# Patient Record
Sex: Female | Born: 1995 | Race: White | Hispanic: No | Marital: Single | Smoking: Never smoker
Health system: Southern US, Community
[De-identification: ages and names within clinical notes are randomized; demographics above are authoritative.]

## PROBLEM LIST (undated history)

## (undated) DIAGNOSIS — E663 Overweight: Secondary | ICD-10-CM

## (undated) HISTORY — PX: TONSILLECTOMY AND ADENOIDECTOMY: SUR1326

## (undated) HISTORY — PX: APPENDECTOMY: SHX54

## (undated) HISTORY — PX: TONSILLECTOMY: SUR1361

## (undated) HISTORY — DX: Overweight: E66.3

---
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
## 2016-08-15 ENCOUNTER — Emergency Department (HOSPITAL_COMMUNITY): Payer: 59

## 2016-08-15 ENCOUNTER — Encounter (HOSPITAL_COMMUNITY): Payer: Self-pay

## 2016-08-15 ENCOUNTER — Emergency Department (HOSPITAL_COMMUNITY)
Admission: EM | Admit: 2016-08-15 | Discharge: 2016-08-15 | Disposition: A | Discharge status: Home / Self Care | Payer: 59 | Attending: Emergency Medicine | Admitting: Emergency Medicine

## 2016-08-15 DIAGNOSIS — R0789 Other chest pain: Secondary | ICD-10-CM | POA: Diagnosis not present

## 2016-08-15 DIAGNOSIS — R079 Chest pain, unspecified: Secondary | ICD-10-CM | POA: Diagnosis present

## 2016-08-15 LAB — I-STAT CHEM 8, ED
BUN: 14 mg/dL (ref 6–20)
CALCIUM ION: 1.21 mmol/L (ref 1.15–1.40)
Chloride: 103 mmol/L (ref 101–111)
Creatinine, Ser: 0.6 mg/dL (ref 0.44–1.00)
GLUCOSE: 88 mg/dL (ref 65–99)
HCT: 40 % (ref 36.0–46.0)
HEMOGLOBIN: 13.6 g/dL (ref 12.0–15.0)
Potassium: 3.4 mmol/L — ABNORMAL LOW (ref 3.5–5.1)
Sodium: 139 mmol/L (ref 135–145)
TCO2: 24 mmol/L (ref 0–100)

## 2016-08-15 LAB — I-STAT BETA HCG BLOOD, ED (MC, WL, AP ONLY): I-stat hCG, quantitative: <5 m[IU]/mL (ref <5)

## 2016-08-15 LAB — D-DIMER, QUANTITATIVE (NOT AT ARMC): D DIMER QUANT: 0.39 ug{FEU}/mL (ref 0.00–0.50)

## 2016-08-15 MED ORDER — ALPRAZOLAM 0.25 MG PO TABS
0.2500 mg | ORAL_TABLET | Freq: Once | ORAL | Status: AC
  Administered 2016-08-15: 0.25 mg via ORAL
  Filled 2016-08-15: qty 1

## 2016-08-15 NOTE — ED Triage Notes (Signed)
PT C/O MID-STERNAL CHEST PAIN RADIATING BETWEEN THE SHOULDER BLADES SINCE LAST NIGHT, IN WHICH TODAY SHE GOT SOB DUE TO THE PAIN . PT STS THE PAIN STARTED IN HER LOWER ABDOMEN YESTERDAY AND WENT UP TO HER CHEST, LEFT SHOULDER, AND UPPER BACK SINCE LAST NIGHT. TH PAIN IS NOT REPRODUCED WITH MOVEMENT. DENIES N/V.

## 2016-08-15 NOTE — ED Provider Notes (Signed)
[facility]-EMERGENCY DEPT Provider Note   CSN: [id number] Arrival date & time: 08/15/16  1801     History   Chief Complaint Chief Complaint  Patient presents with  . Chest Pain    HPI Rebecca HiltsSara R Shepherd is a 20 y.o. female.  20 year old female presents with two-day history of lower abdominal pain which is now radiating to her upper chest and between her shoulder blades goes on her arm. Patient states that the lower abdominal pain is since subsided. Patient did have some associated dyspnea. No syncope or near syncope. Denies any vaginal bleeding or discharge. No urinary symptoms. Pain is not worse with movement. Does admit to increased stress and anxiety recently. Denies any lower leg pain. No recent travel history.      History reviewed. No pertinent past medical history.  Patient Active Problem List   Diagnosis Date Noted  . Acute appendicitis 11/14/2015    Past Surgical History:  Procedure Laterality Date  . LAPAROSCOPIC APPENDECTOMY N/A 11/14/2015   Procedure: APPENDECTOMY LAPAROSCOPIC;  Surgeon: Emelia LoronMatthew Wakefield, MD;  Location: [facility] OR;  Service: General;  Laterality: N/A;  . TONSILLECTOMY      OB History    No data available       Home Medications    Prior to Admission medications   Medication Sig Start Date End Date Taking? Authorizing Provider  albendazole (ALBENZA) 200 MG tablet Take 2 tablets (400 mg total) by mouth once. Then repeat taking 2 tabs by mouth 2 weeks later 11/19/15   Judyann Munsonynthia Snider, MD  cetirizine (ZYRTEC) 10 MG tablet Take 10 mg by mouth at bedtime.    Historical Provider, MD  fluticasone (FLONASE) 50 MCG/ACT nasal spray Place 1 spray into both nostrils daily as needed for allergies or rhinitis (for congestion).    Historical Provider, MD  HYDROcodone-acetaminophen (NORCO/VICODIN) 5-325 MG tablet Take 1-2 tablets by mouth every 4 (four) hours as needed for moderate pain. 11/15/15   Chevis PrettyPaul Toth III, MD  VELIVET 0.1/0.125/0.15 -0.025 MG tablet Take 1 tablet by  mouth at bedtime. 11/08/15   Historical Provider, MD    Family History History reviewed. No pertinent family history.  Social History Social History  Substance Use Topics  . Smoking status: Never Smoker  . Smokeless tobacco: Never Used  . Alcohol use No     Allergies   Augmentin [amoxicillin-pot clavulanate]   Review of Systems Review of Systems  All other systems reviewed and are negative.    Physical Exam Updated Vital Signs BP 124/79 (BP Location: Right Arm)   Pulse 90   Temp 98.7 F (37.1 C) (Oral)   Resp 20   Ht 5\' 8"  (1.727 m)   Wt 81.6 kg   LMP 07/25/2016   SpO2 100%   BMI 27.37 kg/m   Physical Exam  Constitutional: She is oriented to person, place, and time. She appears well-developed and well-nourished.  Non-toxic appearance. No distress.  HENT:  Head: Normocephalic and atraumatic.  Eyes: Conjunctivae, EOM and lids are normal. Pupils are equal, round, and reactive to light.  Neck: Normal range of motion. Neck supple. No tracheal deviation present. No thyroid mass present.  Cardiovascular: Regular rhythm and normal heart sounds.  Tachycardia present.  Exam reveals no gallop.   No murmur heard. Pulmonary/Chest: Effort normal and breath sounds normal. No stridor. No respiratory distress. She has no decreased breath sounds. She has no wheezes. She has no rhonchi. She has no rales.  Abdominal: Soft. Normal appearance and bowel sounds are normal. She  exhibits no distension. There is no tenderness. There is no rebound and no CVA tenderness.  Musculoskeletal: Normal range of motion. She exhibits no edema or tenderness.  Neurological: She is alert and oriented to person, place, and time. She has normal strength. No cranial nerve deficit or sensory deficit. GCS eye subscore is 4. GCS verbal subscore is 5. GCS motor subscore is 6.  Skin: Skin is warm and dry. No abrasion and no rash noted.  Psychiatric: Her speech is normal and behavior is normal. Her mood appears  anxious.  Nursing note and vitals reviewed.    ED Treatments / Results  Labs (all labs ordered are listed, but only abnormal results are displayed) Labs Reviewed  D-DIMER, QUANTITATIVE (NOT AT [facility])  I-STAT CHEM 8, ED  I-STAT BETA HCG BLOOD, ED ([facility], [facility], [facility] ONLY)    EKG  EKG Interpretation  Date/Time:  Sunday August 15 2016 18:14:52 EST Ventricular Rate:  104 PR Interval:    QRS Duration: 84 QT Interval:  337 QTC Calculation: 444 R Axis:   75 Text Interpretation:  Sinus tachycardia Probable left atrial enlargement Baseline wander in lead(s) V5 Confirmed by Freida BusmanALLEN  MD, Summar Mcglothlin ([id number]) on 08/15/2016 6:23:03 PM       Radiology Dg Chest 2 View  Result Date: 08/15/2016 CLINICAL DATA:  Chest pain, short of breath EXAM: CHEST  2 VIEW COMPARISON:  None. FINDINGS: Normal mediastinum and cardiac silhouette. Normal pulmonary vasculature. No evidence of effusion, infiltrate, or pneumothorax. No acute bony abnormality. IMPRESSION: No acute cardiopulmonary process. Electronically Signed   By: Genevive BiStewart  Edmunds M.D.   On: 08/15/2016 18:28    Procedures Procedures (including critical care time)  Medications Ordered in ED Medications  ALPRAZolam (XANAX) tablet 0.25 mg (not administered)     Initial Impression / Assessment and Plan / ED Course  I have reviewed the triage vital signs and the nursing notes.  Pertinent labs & imaging results that were available during my care of the patient were reviewed by me and considered in my medical decision making (see chart for details).  Clinical Course     Patient given Xanax and feels better. D-dimer negative. Patient has been very anxious and suspect this is was causing her tachycardia. When I speak to the patient and she comes down her heart rate goes down to the high 90s. When she becomes ages and does go up. Chest x-ray reassuring. We discharged home with primary care referral given  Final Clinical Impressions(s) / ED Diagnoses    Final diagnoses:  None    New Prescriptions New Prescriptions   No medications on file     Lorre NickAnthony Declan Adamson, MD 08/15/16 2026

## 2016-08-15 NOTE — ED Notes (Signed)
Signature pad not working. Secretary made aware 

## 2016-08-17 ENCOUNTER — Institutional Professional Consult (permissible substitution): Payer: Self-pay | Admitting: Family Medicine

## 2016-08-18 ENCOUNTER — Other Ambulatory Visit: Payer: Self-pay | Admitting: Family Medicine

## 2016-08-18 ENCOUNTER — Ambulatory Visit (INDEPENDENT_AMBULATORY_CARE_PROVIDER_SITE_OTHER): Payer: 59 | Admitting: Family Medicine

## 2016-08-18 ENCOUNTER — Encounter: Payer: Self-pay | Admitting: Family Medicine

## 2016-08-18 VITALS — BP 120/80 | HR 99 | Wt 201.0 lb

## 2016-08-18 DIAGNOSIS — F439 Reaction to severe stress, unspecified: Secondary | ICD-10-CM

## 2016-08-18 DIAGNOSIS — F419 Anxiety disorder, unspecified: Secondary | ICD-10-CM | POA: Diagnosis not present

## 2016-08-18 LAB — CBC WITH DIFFERENTIAL/PLATELET
BASOS ABS: 60 {cells}/uL (ref 0–200)
Basophils Relative: 1 %
EOS ABS: 180 {cells}/uL (ref 15–500)
EOS PCT: 3 %
HCT: 38.4 % (ref 35.0–45.0)
HEMOGLOBIN: 12.8 g/dL (ref 11.7–15.5)
LYMPHS ABS: 2280 {cells}/uL (ref 850–3900)
Lymphocytes Relative: 38 %
MCH: 28.6 pg (ref 27.0–33.0)
MCHC: 33.3 g/dL (ref 32.0–36.0)
MCV: 85.7 fL (ref 80.0–100.0)
MPV: 9.4 fL (ref 7.5–12.5)
Monocytes Absolute: 480 cells/uL (ref 200–950)
Monocytes Relative: 8 %
NEUTROS ABS: 3000 {cells}/uL (ref 1500–7800)
Neutrophils Relative %: 50 %
Platelets: 215 10*3/uL (ref 140–400)
RBC: 4.48 MIL/uL (ref 3.80–5.10)
RDW: 13 % (ref 11.0–15.0)
WBC: 6 10*3/uL (ref 4.0–10.5)

## 2016-08-18 LAB — COMPREHENSIVE METABOLIC PANEL
ALBUMIN: 4.6 g/dL (ref 3.6–5.1)
ALK PHOS: 65 U/L (ref 47–176)
ALT: 46 U/L — ABNORMAL HIGH (ref 5–32)
AST: 28 U/L (ref 12–32)
BILIRUBIN TOTAL: 0.4 mg/dL (ref 0.2–1.1)
BUN: 13 mg/dL (ref 7–20)
CHLORIDE: 105 mmol/L (ref 98–110)
CO2: 23 mmol/L (ref 20–31)
CREATININE: 0.67 mg/dL (ref 0.50–1.00)
Calcium: 9.7 mg/dL (ref 8.9–10.4)
Glucose, Bld: 90 mg/dL (ref 65–99)
Potassium: 4 mmol/L (ref 3.8–5.1)
SODIUM: 137 mmol/L (ref 135–146)
TOTAL PROTEIN: 7.3 g/dL (ref 6.3–8.2)

## 2016-08-18 LAB — TSH: TSH: 1.2 m[IU]/L (ref 0.50–4.30)

## 2016-08-18 NOTE — Progress Notes (Signed)
Subjective:    Patient ID: Rebecca Shepherd, female    DOB: 07-02-1996, 20 y.o.   MRN: [id number]  HPI Chief Complaint  Patient presents with  . new pt    new pt established. panic attacks- last year and half. declines flu shot   She is new to the practice and here to establish care and for an acute complaint.  States she has been having anxiety and thinks she is having panic attacks. States 3 days ago she was having chest pain and went to ED. Ruled out cardiac etiology.  States she is having 2 panic attacks per week.   States the intensity and duration of the attacks are worse over the past several weeks. Reports panic attacks used to last 5 minutes and now states they are lasting 25-30 minutes. Mainly happening in the evening time. She has never taken medication for these.  Parents are separated, this has been recently. 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 States she feels an increase in stress.  She has been on birth control pills and stopped taking them 2 months ago. Had one cycle. States she was taking hormones to regulate her menstrual cycles and not for contraception. Denies being sexually active. States she has never had sex.   Previous medical care: pediatrician until age 20 Dr. Hart RochesterLawson.   Other providers: OB/GYN Dr. Jarold SongMeissinger is OB/GYN   Past medical history: year round allergies- takes Zyrtec.  Has taken allergy injections in past but stopped. Had an allergist but stopped going.   Surgeries: appendectomy last year. Tonsils and adenoids.   Family history: father with depression. Sister with IBS. Mother diabetes, hypertension, hyperlipidemia.  Mental Health History: anxiety as childhood.   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 [employer] blue  Denies smoking, drinking alcohol, drug use  Diet: nothing particular.  Excerise: twice weekly   Immunizations: refuses flu shot.   Wears seatbelt always, uses  sunscreen, smoke detectors in home and functioning, does not text while driving and feels safe in home environment.   Reviewed allergies, medications, past medical, surgical, family, and social history.   GAD 7 : Generalized Anxiety Score 08/18/2016  Nervous, Anxious, on Edge 1  Control/stop worrying 1  Worry too much - different things 1  Trouble relaxing 1  Restless 1  Easily annoyed or irritable 2  Afraid - awful might happen 0  Total GAD 7 Score 7  Anxiety Difficulty Somewhat difficult    Depression screen PHQ 2/9 08/18/2016  Decreased Interest 0  Down, Depressed, Hopeless 1  PHQ - 2 Score 1  Altered sleeping 1  Tired, decreased energy 1  Change in appetite 0  Feeling bad or failure about yourself  0  Trouble concentrating 1  Moving slowly or fidgety/restless 1  Suicidal thoughts 0  PHQ-9 Score 5       Review of Systems Pertinent positives and negatives in the history of present illness.     Objective:   Physical Exam  Constitutional: She is oriented to person, place, and time. She appears well-developed and well-nourished. No distress.  HENT:  Mouth/Throat: Oropharynx is clear and moist.  Neck: Normal range of motion. Neck supple. No thyromegaly present.  Cardiovascular: Normal rate, regular rhythm, normal heart sounds and intact distal pulses.  Exam reveals no gallop and no friction rub.   No murmur heard. Pulmonary/Chest: Effort normal and breath sounds normal.  Lymphadenopathy:    She  has no cervical adenopathy.  Neurological: She is alert and oriented to person, place, and time. Coordination normal.  Skin: Skin is warm and dry. No rash noted. No erythema. No pallor.  Psychiatric: Her speech is normal and behavior is normal. Judgment and thought content normal. Her mood appears anxious. Cognition and memory are normal. She expresses no homicidal ideation. She expresses no homicidal plans.   BP 120/80   Pulse 99   Wt 201 lb (91.2 kg)   LMP 07/25/2016    BMI 30.56 kg/m      Assessment & Plan:  Anxiety - Plan: CBC with Differential/Platelet, Comprehensive metabolic panel, TSH, Ambulatory referral to Psychology  Stress - Plan: Ambulatory referral to Psychology  Reviewed ED visit in chart and they ruled out a cardiac etiology. Negative D-dimer. No chest pain in past 36 hours.  She does not want to start on medication for anxiety. She is in agreement to call and schedule a counseling appointment. I do not feel that she is a danger to herself or others.  Plan to have her start counseling and follow up in 1 month.  Follow up pending labs or sooner if needed.

## 2016-08-18 NOTE — Patient Instructions (Signed)
Call and schedule the counseling appointment.  Follow up in 1 month or sooner if needed.  We will call you with lab results.   Generalized Anxiety Disorder Generalized anxiety disorder (GAD) is a mental disorder. It interferes with life functions, including relationships, work, and school. GAD is different from normal anxiety, which everyone experiences at some point in their lives in response to specific life events and activities. Normal anxiety actually helps us prepare for and get through these life events and activities. Normal anxiety goes away after the event or activity is over.  GAD causes anxiety that is not necessarily related to specific events or activities. It also causes excess anxiety in proportion to specific events or activities. The anxiety associated with GAD is also difficult to control. GAD can vary from mild to severe. People with severe GAD can have intense waves of anxiety with physical symptoms (panic attacks).  SYMPTOMS The anxiety and worry associated with GAD are difficult to control. This anxiety and worry are related to many life events and activities and also occur more days than not for 6 months or longer. People with GAD also have three or more of the following symptoms (one or more in children):  Restlessness.   Fatigue.  Difficulty concentrating.   Irritability.  Muscle tension.  Difficulty sleeping or unsatisfying sleep. DIAGNOSIS GAD is diagnosed through an assessment by your health care provider. Your health care provider will ask you questions aboutyour mood,physical symptoms, and events in your life. Your health care provider may ask you about your medical history and use of alcohol or drugs, including prescription medicines. Your health care provider may also do a physical exam and blood tests. Certain medical conditions and the use of certain substances can cause symptoms similar to those associated with GAD. Your health care provider may refer  you to a mental health specialist for further evaluation. TREATMENT The following therapies are usually used to treat GAD:   Medication. Antidepressant medication usually is prescribed for long-term daily control. Antianxiety medicines may be added in severe cases, especially when panic attacks occur.   Talk therapy (psychotherapy). Certain types of talk therapy can be helpful in treating GAD by providing support, education, and guidance. A form of talk therapy called cognitive behavioral therapy can teach you healthy ways to think about and react to daily life events and activities.  Stress managementtechniques. These include yoga, meditation, and exercise and can be very helpful when they are practiced regularly. A mental health specialist can help determine which treatment is best for you. Some people see improvement with one therapy. However, other people require a combination of therapies. This information is not intended to replace advice given to you by your health care provider. Make sure you discuss any questions you have with your health care provider. Document Released: 01/15/2013 Document Revised: 10/11/2014 Document Reviewed: 01/15/2013 Elsevier Interactive Patient Education  2017 Elsevier Inc.     Panic Attacks Panic attacks are sudden, short feelings of great fear or discomfort. You may have them for no reason when you are relaxed, when you are uneasy (anxious), or when you are sleeping. Follow these instructions at home:  Take all your medicines as told.  Check with your doctor before starting new medicines.  Keep all doctor visits. Contact a doctor if:  You are not able to take your medicines as told.  Your symptoms do not get better.  Your symptoms get worse. Get help right away if:  Your attacks seem different than  your normal attacks.  You have thoughts about hurting yourself or others.  You take panic attack medicine and you have a side effect. This  information is not intended to replace advice given to you by your health care provider. Make sure you discuss any questions you have with your health care provider. Document Released: 10/23/2010 Document Revised: 02/26/2016 Document Reviewed: 05/04/2013 Elsevier Interactive Patient Education  2017 [employer]

## 2016-08-19 LAB — HEPATITIS PANEL, ACUTE
HCV AB: NEGATIVE
HEP B C IGM: NONREACTIVE
HEP B S AG: NEGATIVE
Hep A IgM: NONREACTIVE

## 2016-08-24 ENCOUNTER — Telehealth: Payer: Self-pay | Admitting: Family Medicine

## 2016-08-24 NOTE — Telephone Encounter (Signed)
Left detailed message that sometimes they take longer if busy and with holidays coming up, they might take a while, gave phone number for [facility] if she wanted to call there

## 2016-08-24 NOTE — Telephone Encounter (Signed)
Pt has called [facility] 3 times and left a message and no one has called her back. Pt wants to know if this is normal or if Vickie thinks that she should call another office.

## 2016-08-24 NOTE — Telephone Encounter (Signed)
Please call and find out which number she has been trying to call to schedule her appointment. Please give her other phone numbers.

## 2016-09-15 ENCOUNTER — Ambulatory Visit (INDEPENDENT_AMBULATORY_CARE_PROVIDER_SITE_OTHER): Payer: 59 | Admitting: Family Medicine

## 2016-09-15 ENCOUNTER — Encounter: Payer: Self-pay | Admitting: Family Medicine

## 2016-09-15 VITALS — BP 110/70 | HR 81 | Wt 203.0 lb

## 2016-09-15 DIAGNOSIS — R7989 Other specified abnormal findings of blood chemistry: Secondary | ICD-10-CM

## 2016-09-15 DIAGNOSIS — G44209 Tension-type headache, unspecified, not intractable: Secondary | ICD-10-CM | POA: Diagnosis not present

## 2016-09-15 DIAGNOSIS — R945 Abnormal results of liver function studies: Secondary | ICD-10-CM

## 2016-09-15 DIAGNOSIS — F419 Anxiety disorder, unspecified: Secondary | ICD-10-CM

## 2016-09-15 LAB — COMPREHENSIVE METABOLIC PANEL
ALT: 33 U/L — ABNORMAL HIGH (ref 5–32)
AST: 27 U/L (ref 12–32)
Albumin: 4.2 g/dL (ref 3.6–5.1)
Alkaline Phosphatase: 63 U/L (ref 47–176)
BILIRUBIN TOTAL: 0.6 mg/dL (ref 0.2–1.1)
BUN: 10 mg/dL (ref 7–20)
CHLORIDE: 104 mmol/L (ref 98–110)
CO2: 26 mmol/L (ref 20–31)
CREATININE: 0.68 mg/dL (ref 0.50–1.00)
Calcium: 9.4 mg/dL (ref 8.9–10.4)
GLUCOSE: 80 mg/dL (ref 65–99)
Potassium: 3.9 mmol/L (ref 3.8–5.1)
SODIUM: 138 mmol/L (ref 135–146)
Total Protein: 7.1 g/dL (ref 6.3–8.2)

## 2016-09-15 NOTE — Patient Instructions (Signed)

## 2016-09-15 NOTE — Progress Notes (Signed)
   Subjective:    Patient ID: Rebecca HiltsSara R Rayle, female    DOB: Feb 22, 1996, 20 y.o.   MRN: [id number]  HPI Chief Complaint  Patient presents with  . 4 week follow-up    4 week follow-up. frequent headaches   She is here for follow up on anxiety. States she is doing better. Panic attacks are not as frequent. She is on break from school now until January.   She has not been to counseling yet but has an appointment on January 13 with Berniece AndreasJulie Whitt. She states she is ok waiting until then.   Complains of getting headaches that come on typically later in the day. Reports having 3 in the past 10 days. Does not have a headache today.  Reports having a dull ache behind her eyes and occasionally at the base of her neck. Pain is constant and non pulsating.  States she feels like they her head is being squeezed. Has been studying for exams lately and has reduced her caffeine intake pretty drastically.  Denies photophobia, phonophobia, blurred or double vision, dizziness,  N/V/D. No fever, chills, chest pain, shortness of breath, abdominal or back pain.  Has been getting more sleep since finishing exams and thinks this might help.  Has seen a chiropractor in the past for headaches. Plans to schedule appointment for this.  Does not smoke or drink alcohol.   Her LFTs were elevated previously and negative hepatitis panel. Plan to recheck labs today.   Reviewed allergies, medications, past medical, and social history.    Review of Systems Pertinent positives and negatives in the history of present illness.     Objective:   Physical Exam BP 110/70   Pulse 81   Wt 203 lb (92.1 kg)   BMI 30.87 kg/m   Alert and oriented and in no acute distress. Not otherwise examined.       Assessment & Plan:  Anxiety  Tension-type headache, not intractable, unspecified chronicity pattern  Elevated LFTs - Plan: Comprehensive metabolic panel  Discussed that her anxiety appears to be improving. She has decreased  stress currently being out of school break. She will see her counselor as schedules in January and see me sooner if needed.  Recommend for her headaches that sound like tension headaches, make sure she is well hydrated, getting regular sleep and to use good body mechanics. She may get a massage or see the chiropractor. Discussed using heat to her neck and doing stretches. She may take ibuprofen as needed. She will call or return if her headaches increase in frequency or become more bothersome. Will repeat LFTs today. Follow-up pending labs or as needed for headaches and anxiety.

## 2016-10-19 ENCOUNTER — Ambulatory Visit (INDEPENDENT_AMBULATORY_CARE_PROVIDER_SITE_OTHER): Payer: 59 | Admitting: Licensed Clinical Social Worker

## 2016-10-19 DIAGNOSIS — F410 Panic disorder [episodic paroxysmal anxiety] without agoraphobia: Secondary | ICD-10-CM

## 2016-11-10 ENCOUNTER — Ambulatory Visit: Payer: 59 | Admitting: Licensed Clinical Social Worker

## 2016-11-25 ENCOUNTER — Ambulatory Visit (INDEPENDENT_AMBULATORY_CARE_PROVIDER_SITE_OTHER): Payer: 59 | Admitting: Licensed Clinical Social Worker

## 2016-11-25 DIAGNOSIS — F410 Panic disorder [episodic paroxysmal anxiety] without agoraphobia: Secondary | ICD-10-CM | POA: Diagnosis not present

## 2016-12-16 ENCOUNTER — Ambulatory Visit (INDEPENDENT_AMBULATORY_CARE_PROVIDER_SITE_OTHER): Payer: 59 | Admitting: Licensed Clinical Social Worker

## 2016-12-16 DIAGNOSIS — F410 Panic disorder [episodic paroxysmal anxiety] without agoraphobia: Secondary | ICD-10-CM

## 2017-01-13 ENCOUNTER — Ambulatory Visit: Payer: 59 | Admitting: Licensed Clinical Social Worker

## 2017-01-27 ENCOUNTER — Ambulatory Visit (INDEPENDENT_AMBULATORY_CARE_PROVIDER_SITE_OTHER): Payer: 59 | Admitting: Licensed Clinical Social Worker

## 2017-01-27 DIAGNOSIS — F410 Panic disorder [episodic paroxysmal anxiety] without agoraphobia: Secondary | ICD-10-CM

## 2017-03-04 ENCOUNTER — Ambulatory Visit: Payer: 59 | Admitting: Licensed Clinical Social Worker

## 2017-03-24 ENCOUNTER — Ambulatory Visit: Payer: 59 | Admitting: Licensed Clinical Social Worker

## 2017-04-14 ENCOUNTER — Ambulatory Visit (INDEPENDENT_AMBULATORY_CARE_PROVIDER_SITE_OTHER): Payer: 59 | Admitting: Licensed Clinical Social Worker

## 2017-04-14 DIAGNOSIS — F410 Panic disorder [episodic paroxysmal anxiety] without agoraphobia: Secondary | ICD-10-CM

## 2017-04-19 ENCOUNTER — Encounter: Payer: Self-pay | Admitting: Family Medicine

## 2017-04-19 ENCOUNTER — Ambulatory Visit (INDEPENDENT_AMBULATORY_CARE_PROVIDER_SITE_OTHER): Payer: 59 | Admitting: Family Medicine

## 2017-04-19 VITALS — BP 120/80 | HR 87 | Temp 98.7°F | Wt 191.1 lb

## 2017-04-19 DIAGNOSIS — R350 Frequency of micturition: Secondary | ICD-10-CM | POA: Diagnosis not present

## 2017-04-19 DIAGNOSIS — M545 Low back pain, unspecified: Secondary | ICD-10-CM

## 2017-04-19 DIAGNOSIS — R1031 Right lower quadrant pain: Secondary | ICD-10-CM | POA: Diagnosis not present

## 2017-04-19 DIAGNOSIS — N3001 Acute cystitis with hematuria: Secondary | ICD-10-CM | POA: Diagnosis not present

## 2017-04-19 DIAGNOSIS — R748 Abnormal levels of other serum enzymes: Secondary | ICD-10-CM

## 2017-04-19 LAB — COMPREHENSIVE METABOLIC PANEL WITH GFR
ALT: 19 U/L (ref 5–32)
AST: 19 U/L (ref 12–32)
Albumin: 4.3 g/dL (ref 3.6–5.1)
Alkaline Phosphatase: 62 U/L (ref 47–176)
BUN: 13 mg/dL (ref 7–20)
CO2: 22 mmol/L (ref 20–31)
Calcium: 9.4 mg/dL (ref 8.9–10.4)
Chloride: 103 mmol/L (ref 98–110)
Creat: 0.72 mg/dL (ref 0.50–1.00)
Glucose, Bld: 76 mg/dL (ref 65–99)
Potassium: 4.2 mmol/L (ref 3.8–5.1)
Sodium: 137 mmol/L (ref 135–146)
Total Bilirubin: 0.5 mg/dL (ref 0.2–1.1)
Total Protein: 7.1 g/dL (ref 6.3–8.2)

## 2017-04-19 LAB — CBC WITH DIFFERENTIAL/PLATELET
Basophils Absolute: 51 {cells}/uL (ref 0–200)
Basophils Relative: 1 %
Eosinophils Absolute: 204 {cells}/uL (ref 15–500)
Eosinophils Relative: 4 %
HCT: 39.2 % (ref 35.0–45.0)
Hemoglobin: 13.2 g/dL (ref 11.7–15.5)
Lymphocytes Relative: 39 %
Lymphs Abs: 1989 {cells}/uL (ref 850–3900)
MCH: 29.7 pg (ref 27.0–33.0)
MCHC: 33.7 g/dL (ref 32.0–36.0)
MCV: 88.1 fL (ref 80.0–100.0)
MPV: 9.7 fL (ref 7.5–12.5)
Monocytes Absolute: 357 {cells}/uL (ref 200–950)
Monocytes Relative: 7 %
Neutro Abs: 2499 {cells}/uL (ref 1500–7800)
Neutrophils Relative %: 49 %
Platelets: 206 K/uL (ref 140–400)
RBC: 4.45 MIL/uL (ref 3.80–5.10)
RDW: 13.1 % (ref 11.0–15.0)
WBC: 5.1 K/uL (ref 4.0–10.5)

## 2017-04-19 LAB — POCT URINALYSIS DIP (PROADVANTAGE DEVICE)
BILIRUBIN UA: NEGATIVE mg/dL
Bilirubin, UA: NEGATIVE
GLUCOSE UA: NEGATIVE mg/dL
Leukocytes, UA: NEGATIVE
Nitrite, UA: NEGATIVE
Protein Ur, POC: NEGATIVE mg/dL
SPECIFIC GRAVITY, URINE: 1.03
Urobilinogen, Ur: NEGATIVE
pH, UA: 6 (ref 5.0–8.0)

## 2017-04-19 MED ORDER — NITROFURANTOIN MONOHYD MACRO 100 MG PO CAPS: 100.0000 mg | ORAL_CAPSULE | Freq: Two times a day (BID) | ORAL | 0 refills | Status: DC

## 2017-04-19 NOTE — Patient Instructions (Signed)
Take the antibiotic with food as prescribed.  Increase water intake.  You may take over the counter AZO for today and tomorrow if needed and then stop.  If your symptoms worsen or are not resolved after completing the antibiotic then you need to be seen.   Return for a repeat urine test (LAB VISIT ONLY) in 3 weeks. Ideally you would not be menstruating for this test.

## 2017-04-19 NOTE — Progress Notes (Signed)
Subjective:  Rebecca Shepherd is a 21 y.o. female who complains of possible urinary tract infection.  She has had symptoms for 5 day.  Symptoms include urinary frequency, urgency, intermittent RLQ pain, right flank pain that is intermittent. Patient denies fever, chills, vomiting, diarrhea, vaginal discharge.  Last UTI was years ago.   Using nothing for current symptoms.  RLQ pain is relieved with urination.  Normal bowel movement without blood yesterday per patient.   Patient does not have a history of recurrent UTI. Patient does not have a history of pyelonephritis.  No other aggravating or relieving factors.  No other c/o.  History of appendectomy last year. Has had mildly elevated ALT in the past.   History reviewed. No pertinent past medical history.   LMP: 2 weeks ago.  Contraception: daily OCP. Started back on this 3 months ago to regulate her periods.  Sexually activity: is not active. Denies ever having sex.   ROS as in subjective  Reviewed allergies, medications, past medical, surgical, and social history.    Objective: Vitals:   04/19/17 0819  BP: 120/80  Pulse: 87  Temp: 98.7 F (37.1 C)    General appearance: alert, no distress, WD/WN, female Abdomen: +bs, soft, non tender, non distended, no masses, no hepatomegaly, no splenomegaly, no bruits Back: no CVA tenderness GU: declined      Laboratory:  Urine dipstick: 1+ for hemoglobin.       Assessment: Acute cystitis with hematuria - Plan: Comprehensive metabolic panel, CBC with Differential/Platelet  Urinary frequency - Plan: POCT Urinalysis DIP (Proadvantage Device)  RLQ abdominal pain - Plan: Comprehensive metabolic panel, CBC with Differential/Platelet  Acute right-sided low back pain without sciatica  Elevated liver enzymes - Plan: Comprehensive metabolic panel    Plan: Discussed symptoms, diagnosis, possible complications, and usual course of illness.  Begin Macrobid as discussed.   Advised increased  water intake, can use OTC Tylenol for pain.    Advised she may take AZO today and tomorrow.   Urine culture was not sent.  Will recheck LFTs.  Suspect her RLQ and R low back pain are related to UTI. She will let me know if symptoms worsen or any new symptoms present. Advised on when to seek emergency medical treatment.  She denies ever being sexually active so a pelvic exam or UPT not done.   Call or return if worse or not improving.  Follow up repeat UA in 3 weeks to ensure no hematuria.

## 2017-04-20 ENCOUNTER — Telehealth: Payer: Self-pay | Admitting: Internal Medicine

## 2017-04-20 NOTE — Telephone Encounter (Signed)
error 

## 2017-05-04 ENCOUNTER — Encounter (HOSPITAL_COMMUNITY): Payer: Self-pay | Admitting: Emergency Medicine

## 2017-05-04 ENCOUNTER — Emergency Department (HOSPITAL_COMMUNITY)
Admission: EM | Admit: 2017-05-04 | Discharge: 2017-05-04 | Disposition: A | Discharge status: Home / Self Care | Payer: 59 | Attending: Emergency Medicine | Admitting: Emergency Medicine

## 2017-05-04 DIAGNOSIS — R112 Nausea with vomiting, unspecified: Secondary | ICD-10-CM | POA: Diagnosis not present

## 2017-05-04 DIAGNOSIS — R197 Diarrhea, unspecified: Secondary | ICD-10-CM | POA: Diagnosis not present

## 2017-05-04 DIAGNOSIS — Z79899 Other long term (current) drug therapy: Secondary | ICD-10-CM | POA: Diagnosis not present

## 2017-05-04 LAB — URINALYSIS, ROUTINE W REFLEX MICROSCOPIC
Bacteria, UA: NONE SEEN
Glucose, UA: NEGATIVE mg/dL
Ketones, ur: 5 mg/dL — AB
LEUKOCYTES UA: NEGATIVE
NITRITE: NEGATIVE
PH: 5 (ref 5.0–8.0)
Protein, ur: NEGATIVE mg/dL
SPECIFIC GRAVITY, URINE: 1.03 (ref 1.005–1.030)

## 2017-05-04 LAB — COMPREHENSIVE METABOLIC PANEL
ALBUMIN: 4.3 g/dL (ref 3.5–5.0)
ALT: 25 U/L (ref 14–54)
AST: 23 U/L (ref 15–41)
Alkaline Phosphatase: 64 U/L (ref 38–126)
Anion gap: 9 (ref 5–15)
BILIRUBIN TOTAL: 1.2 mg/dL (ref 0.3–1.2)
BUN: 15 mg/dL (ref 6–20)
CO2: 25 mmol/L (ref 22–32)
CREATININE: 0.66 mg/dL (ref 0.44–1.00)
Calcium: 9.4 mg/dL (ref 8.9–10.3)
Chloride: 103 mmol/L (ref 101–111)
GFR calc Af Amer: >60 mL/min (ref >60)
Glucose, Bld: 132 mg/dL — ABNORMAL HIGH (ref 65–99)
Potassium: 4.3 mmol/L (ref 3.5–5.1)
Sodium: 137 mmol/L (ref 135–145)
TOTAL PROTEIN: 7.5 g/dL (ref 6.5–8.1)

## 2017-05-04 LAB — CBC
HCT: 38.3 % (ref 36.0–46.0)
Hemoglobin: 13.6 g/dL (ref 12.0–15.0)
MCH: 30.1 pg (ref 26.0–34.0)
MCHC: 35.5 g/dL (ref 30.0–36.0)
MCV: 84.7 fL (ref 78.0–100.0)
PLATELETS: 208 10*3/uL (ref 150–400)
RBC: 4.52 MIL/uL (ref 3.87–5.11)
RDW: 12.4 % (ref 11.5–15.5)
WBC: 10.9 10*3/uL — AB (ref 4.0–10.5)

## 2017-05-04 LAB — LIPASE, BLOOD: LIPASE: 15 U/L (ref 11–51)

## 2017-05-04 LAB — PREGNANCY, URINE: Preg Test, Ur: NEGATIVE

## 2017-05-04 MED ORDER — ONDANSETRON 4 MG PO TBDP
4.0000 mg | ORAL_TABLET | Freq: Once | ORAL | Status: AC | PRN
  Administered 2017-05-04: 4 mg via ORAL
  Filled 2017-05-04: qty 1

## 2017-05-04 MED ORDER — ONDANSETRON 4 MG PO TBDP: 4.0000 mg | ORAL_TABLET | Freq: Three times a day (TID) | ORAL | 0 refills | Status: DC | PRN

## 2017-05-04 NOTE — ED Notes (Signed)
Pt is asleep at this time.

## 2017-05-04 NOTE — ED Triage Notes (Signed)
Pt states she went out to eat with a friend and as soon as she got home she started having nausea, vomiting, and diarrhea  Pt is c/o abd pain and cramping

## 2017-05-04 NOTE — ED Provider Notes (Signed)
[facility]-EMERGENCY DEPT Provider Note   CSN: [id number] Arrival date & time: 05/04/17  0109     History   Chief Complaint Chief Complaint  Patient presents with  . Abdominal Pain  . Emesis    HPI Rebecca Shepherd is a 21 y.o. female.  Patient presents to the ED with a chief complaint of abdominal pain.  She reports associated nausea, vomiting, and diarrhea.  She states that the symptoms started this evening.  She is unaware of any sick contacts.  She denies any fevers or chills.  She states that she has some crampy abdominal pain, more so in the left upper abdomen.  She denies any pain with urination.  Denies any vaginal discharge or bleeding.  Her symptoms have subsided after getting zofran in triage.   The history is provided by the patient. No language interpreter was used.    History reviewed. No pertinent past medical history.  Patient Active Problem List   Diagnosis Date Noted  . Anxiety 09/15/2016  . Elevated LFTs 09/15/2016    Past Surgical History:  Procedure Laterality Date  . APPENDECTOMY    . LAPAROSCOPIC APPENDECTOMY N/A 11/14/2015   Procedure: APPENDECTOMY LAPAROSCOPIC;  Surgeon: Emelia LoronMatthew Wakefield, MD;  [facility];  Service: General;  Laterality: N/A;  . TONSILLECTOMY    . TONSILLECTOMY AND ADENOIDECTOMY      OB History    No data available       Home Medications    Prior to Admission medications   Medication Sig Start Date End Date Taking? Authorizing Provider  cetirizine (ZYRTEC) 10 MG tablet Take 10 mg by mouth daily.    Yes [provider]  Norethindrone-Ethinyl Estradiol-Fe Biphas (LO LOESTRIN FE) 1 MG-10 MCG / 10 MCG tablet Take 1 tablet by mouth daily.   Yes [provider]  Multiple Vitamins-Minerals (WOMENS DAILY FORMULA PO) Take by mouth.    [provider]  nitrofurantoin, macrocrystal-monohydrate, (MACROBID) 100 MG capsule Take 1 capsule (100 mg total) by mouth 2 (two) times daily. 04/19/17   Henson, Vickie L, NP-C    ondansetron (ZOFRAN ODT) 4 MG disintegrating tablet Take 1 tablet (4 mg total) by mouth every 8 (eight) hours as needed for nausea or vomiting. 05/04/17   Roxy HorsemanBrowning, Sherisse Fullilove, PA-C    Family History Family History  Problem Relation Age of Onset  . Diabetes Other   . Hypertension Other   . Stroke Other     Social History Social History  Substance Use Topics  . Smoking status: Never Smoker  . Smokeless tobacco: Never Used  . Alcohol use No     Allergies   Augmentin [amoxicillin-pot clavulanate]   Review of Systems Review of Systems  All other systems reviewed and are negative.    Physical Exam Updated Vital Signs BP 112/88 (BP Location: Left Arm)   Pulse 87   Temp 98 F (36.7 C) (Oral)   Resp 16   Ht 5\' 8"  (1.727 m)   Wt 87.1 kg (192 lb)   LMP 04/30/2017 (Exact Date)   SpO2 99%   BMI 29.19 kg/m   Physical Exam  Constitutional: She is oriented to person, place, and time. She appears well-developed and well-nourished.  HENT:  Head: Normocephalic and atraumatic.  Eyes: Pupils are equal, round, and reactive to light. Conjunctivae and EOM are normal.  Neck: Normal range of motion. Neck supple.  Cardiovascular: Normal rate and regular rhythm.  Exam reveals no gallop and no friction rub.   No murmur heard.  Pulmonary/Chest: Effort normal and breath sounds normal. No respiratory distress. She has no wheezes. She has no rales. She exhibits no tenderness.  Abdominal: Soft. Bowel sounds are normal. She exhibits no distension and no mass. There is no tenderness. There is no rebound and no guarding.  No focal abdominal tenderness, no RLQ tenderness or pain at McBurney's point, no RUQ tenderness or Murphy's sign, no left-sided abdominal tenderness, no fluid wave, or signs of peritonitis   Musculoskeletal: Normal range of motion. She exhibits no edema or tenderness.  Neurological: She is alert and oriented to person, place, and time.  Skin: Skin is warm and dry.  Psychiatric:  She has a normal mood and affect. Her behavior is normal. Judgment and thought content normal.  Nursing note and vitals reviewed.    ED Treatments / Results  Labs (all labs ordered are listed, but only abnormal results are displayed) Labs Reviewed  COMPREHENSIVE METABOLIC PANEL - Abnormal; Notable for the following:       Result Value   Glucose, Bld 132 (*)    All other components within normal limits  CBC - Abnormal; Notable for the following:    WBC 10.9 (*)    All other components within normal limits  URINALYSIS, ROUTINE W REFLEX MICROSCOPIC - Abnormal; Notable for the following:    Color, Urine AMBER (*)    APPearance CLOUDY (*)    Hgb urine dipstick SMALL (*)    Bilirubin Urine SMALL (*)    Ketones, ur 5 (*)    Squamous Epithelial / LPF 0-5 (*)    All other components within normal limits  LIPASE, BLOOD  PREGNANCY, URINE    EKG  EKG Interpretation None       Radiology No results found.  Procedures Procedures (including critical care time)  Medications Ordered in ED Medications  ondansetron (ZOFRAN-ODT) disintegrating tablet 4 mg (4 mg Oral Given 05/04/17 0144)     Initial Impression / Assessment and Plan / ED Course  I have reviewed the triage vital signs and the nursing notes.  Pertinent labs & imaging results that were available during my care of the patient were reviewed by me and considered in my medical decision making (see chart for details).     Patient with n/v/d.  Seems consistent with viral process.  No focal abdominal tenderness.  No evidence of acute abdomen.  Prior appendectomy.  VSS.  Labs are reassuring.  DC to home with return precautions.  Final Clinical Impressions(s) / ED Diagnoses   Final diagnoses:  Nausea vomiting and diarrhea    New Prescriptions New Prescriptions   ONDANSETRON (ZOFRAN ODT) 4 MG DISINTEGRATING TABLET    Take 1 tablet (4 mg total) by mouth every 8 (eight) hours as needed for nausea or vomiting.     Roxy HorsemanBrowning,  Barbi Kumagai, PA-C 05/04/17 [id number]    Nicanor AlconPalumbo, April, MD 05/04/17 [email or web address] [id number]

## 2017-05-05 ENCOUNTER — Ambulatory Visit (INDEPENDENT_AMBULATORY_CARE_PROVIDER_SITE_OTHER): Payer: 59 | Admitting: Licensed Clinical Social Worker

## 2017-05-05 DIAGNOSIS — F419 Anxiety disorder, unspecified: Secondary | ICD-10-CM

## 2017-05-12 ENCOUNTER — Other Ambulatory Visit (INDEPENDENT_AMBULATORY_CARE_PROVIDER_SITE_OTHER): Payer: 59

## 2017-05-12 DIAGNOSIS — N3001 Acute cystitis with hematuria: Secondary | ICD-10-CM | POA: Diagnosis not present

## 2017-05-12 LAB — POCT URINALYSIS DIP (PROADVANTAGE DEVICE)
BILIRUBIN UA: NEGATIVE
Blood, UA: NEGATIVE
Glucose, UA: NEGATIVE mg/dL
Ketones, POC UA: NEGATIVE mg/dL
LEUKOCYTES UA: NEGATIVE
NITRITE UA: NEGATIVE
PH UA: 6 (ref 5.0–8.0)
Protein Ur, POC: NEGATIVE mg/dL
Specific Gravity, Urine: 1.025
Urobilinogen, Ur: NEGATIVE

## 2017-05-16 IMAGING — CT CT ABD-PELV W/ CM
2 of 4 series · 16 of 46 positions shown, 18 images · IV contrast (omnipaque)
Comparison: None.

CLINICAL DATA: Right lower quadrant pain for 2 days.

EXAM:
CT ABDOMEN AND PELVIS WITH CONTRAST
TECHNIQUE: Multidetector CT imaging of the abdomen and pelvis was performed
using the standard protocol following bolus administration of
intravenous contrast.
CONTRAST:  100mL OMNIPAQUE IOHEXOL 300 MG/ML  SOLN

[Series 2: a/p w/ 5mm · axial · 0.91mm/px · z∈[-480,-30]mm · 13 of 100 slices shown, 15 images]
[im 5/100  soft-tissue]
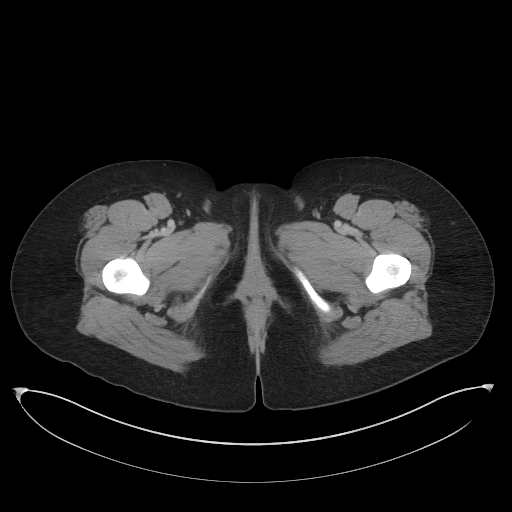
[im 5/100  bone]
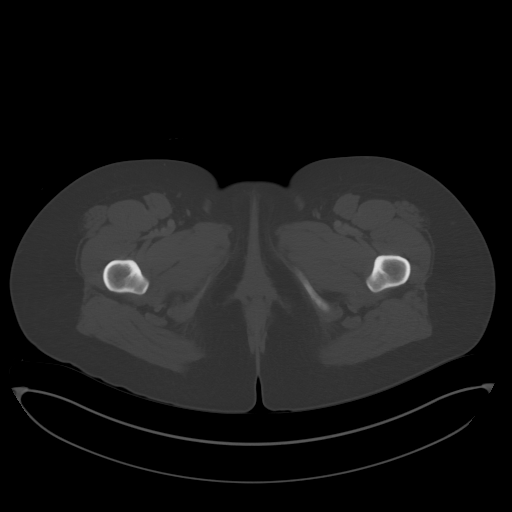
[im 14/100  soft-tissue]
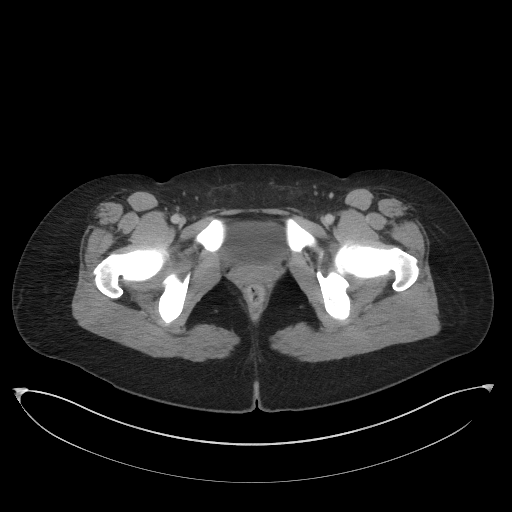
[im 23/100  soft-tissue]
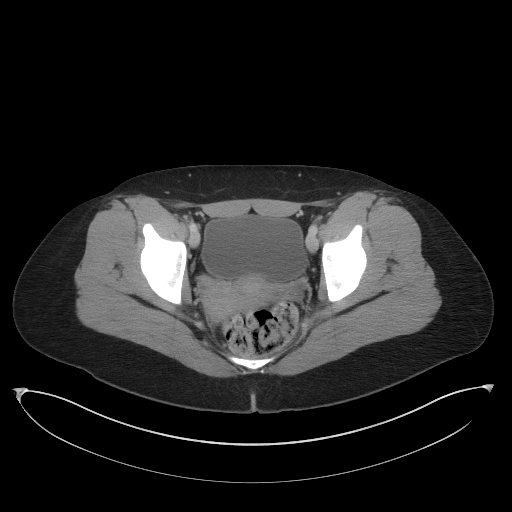
[im 28/100  soft-tissue]
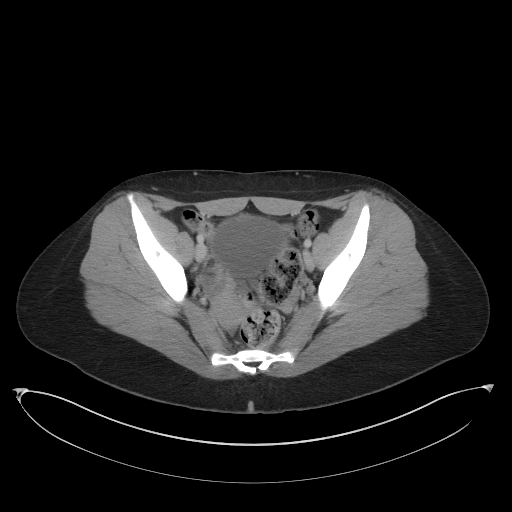
[im 37/100  soft-tissue]
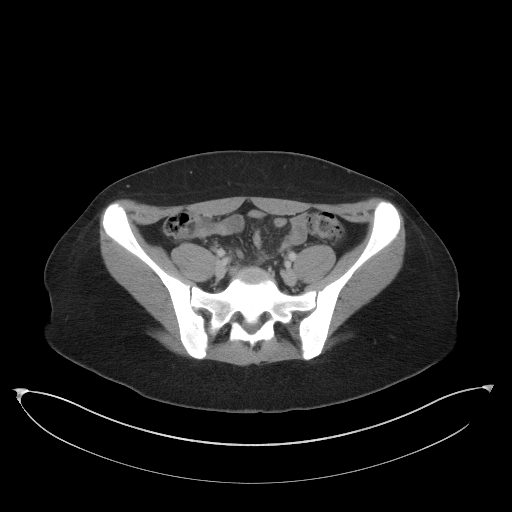
[im 41/100  soft-tissue]
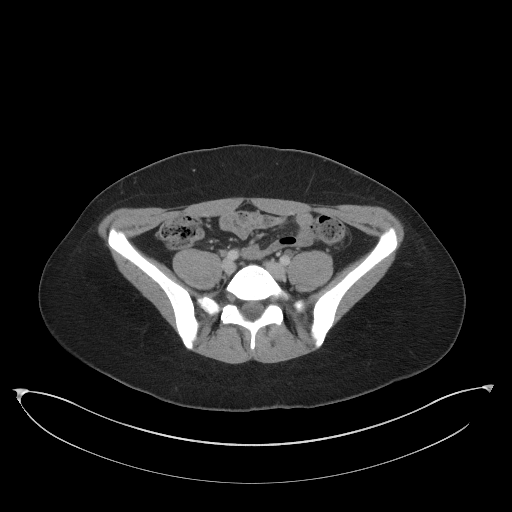
[im 50/100  soft-tissue]
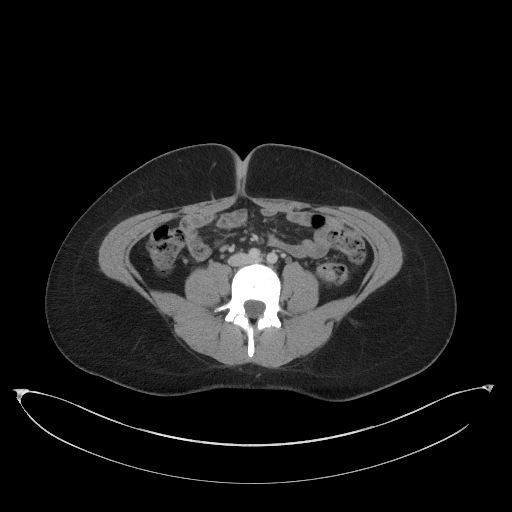
[im 59/100  soft-tissue]
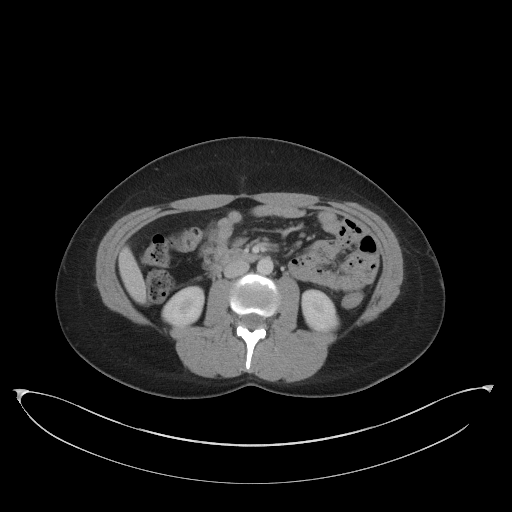
[im 64/100  soft-tissue]
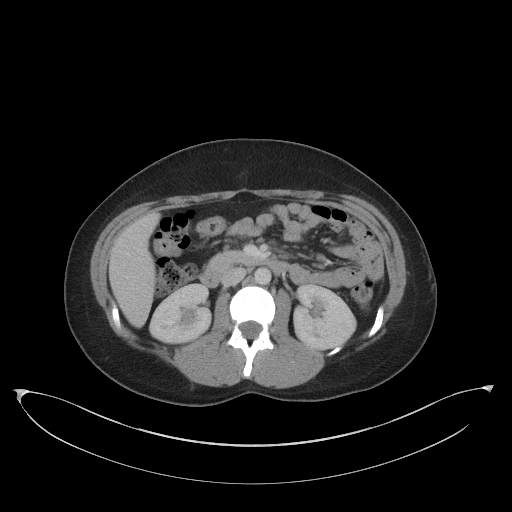
[im 64/100  bone]
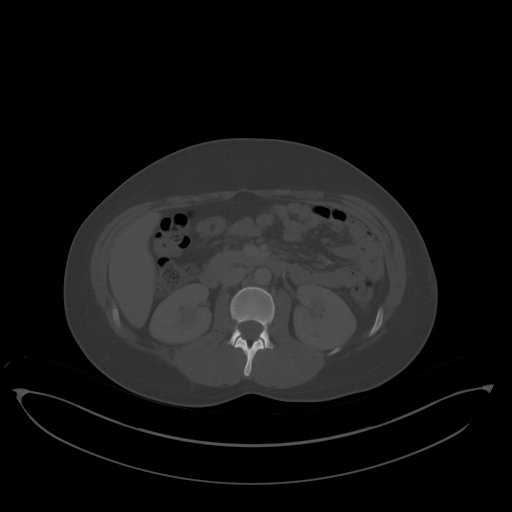
[im 73/100  soft-tissue]
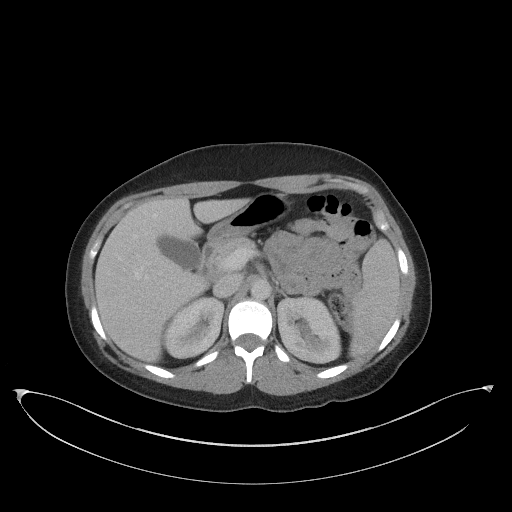
[im 77/100  soft-tissue]
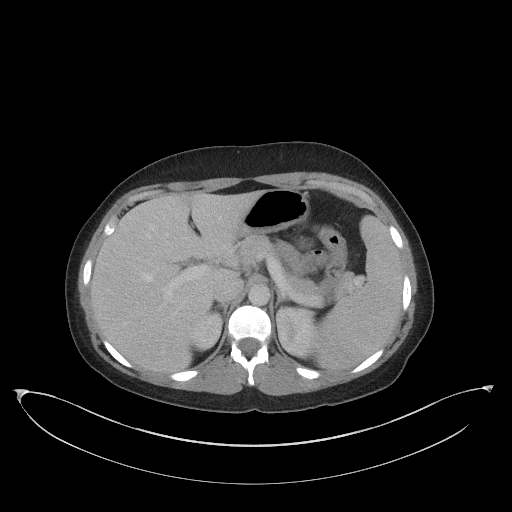
[im 86/100  soft-tissue]
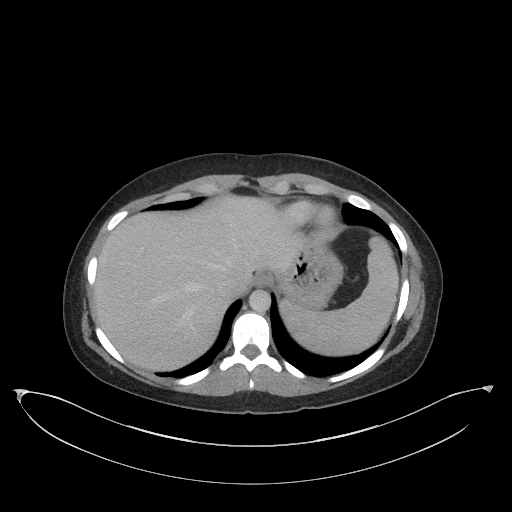
[im 95/100  soft-tissue]
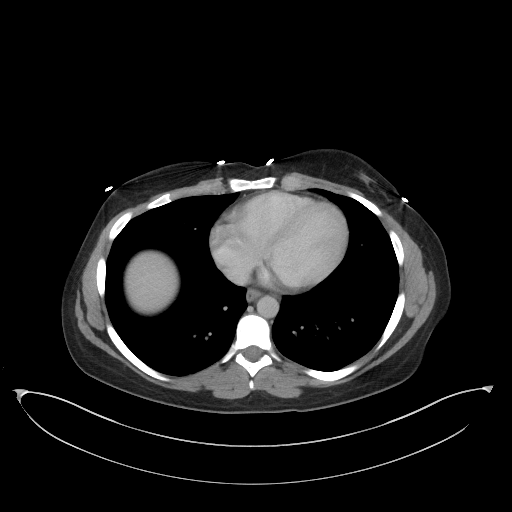

[Series 5: a/p w/ cor · coronal · 0.81mm/px · 3 of 136 slices shown]
[im 46/136  soft-tissue]
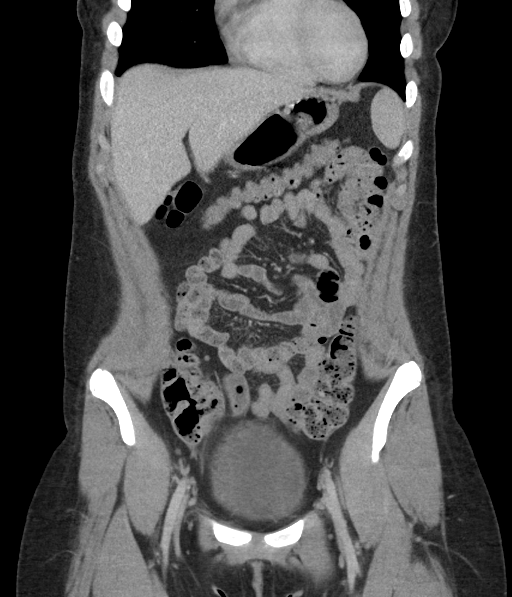
[im 61/136  soft-tissue]
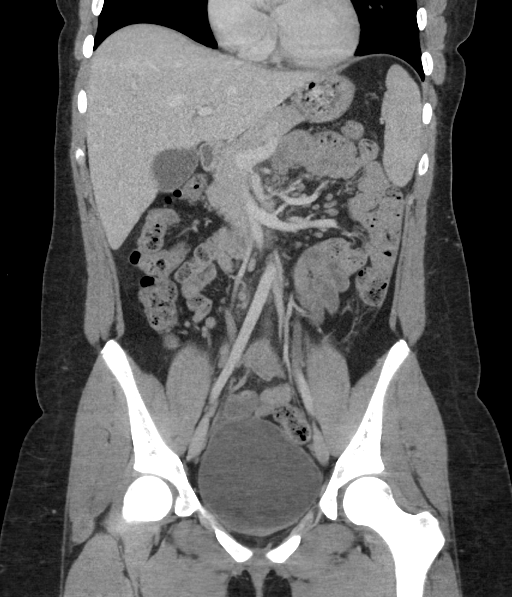
[im 76/136  soft-tissue]
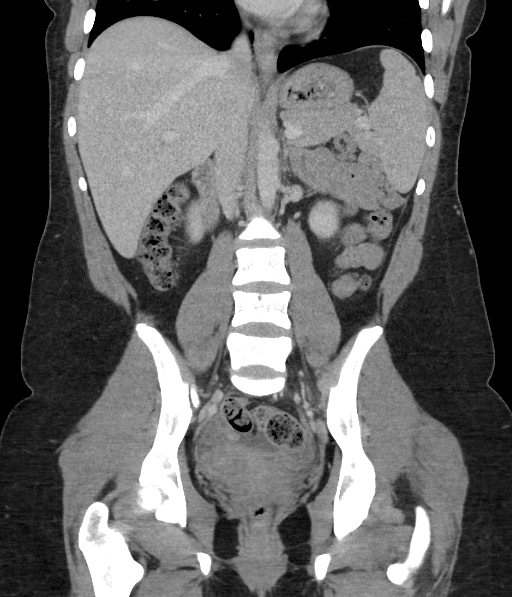

[16 of 46 positions shown; findings below may reference images not displayed]

FINDINGS: Lower chest:  No acute findings.

Hepatobiliary: No masses or other significant abnormality.

Pancreas: No mass, inflammatory changes, or other significant
abnormality.

Spleen: Within normal limits in size and appearance.

Adrenals/Urinary Tract: No masses identified. No evidence of
hydronephrosis.

Stomach/Bowel: No evidence of obstruction, or abnormal fluid
collections. The appendix is long and folds on itself. There is
indistinctness of the wall of the distal appendix and mild wall
thickening, suggestive of early acute appendicitis (best appreciated
on coronal image 75/sequence 5, and axial images 63 to 72/sequence
2).

Vascular/Lymphatic: No pathologically enlarged lymph nodes. No
evidence of abdominal aortic aneurysm. There are shotty central
mesenteric lymph nodes.

Reproductive: No mass or other significant abnormality.

Other: None.

Musculoskeletal:  No suspicious bone lesions identified.
IMPRESSION: Tip appendicitis without evidence of rupture.

Multiple central mesenteric mildly enlarged lymph nodes, probably
reactive.

## 2017-06-19 ENCOUNTER — Other Ambulatory Visit: Payer: Self-pay | Admitting: Family Medicine

## 2017-06-19 MED ORDER — ALBUTEROL SULFATE HFA 108 (90 BASE) MCG/ACT IN AERS: 2.0000 | INHALATION_SPRAY | Freq: Four times a day (QID) | RESPIRATORY_TRACT | 0 refills | Status: DC | PRN

## 2017-07-04 ENCOUNTER — Encounter: Payer: Self-pay | Admitting: Family Medicine

## 2017-07-04 ENCOUNTER — Ambulatory Visit (INDEPENDENT_AMBULATORY_CARE_PROVIDER_SITE_OTHER): Payer: 59 | Admitting: Family Medicine

## 2017-07-04 VITALS — BP 110/78 | HR 72 | Temp 98.2°F | Resp 16 | Wt 200.2 lb

## 2017-07-04 DIAGNOSIS — J029 Acute pharyngitis, unspecified: Secondary | ICD-10-CM

## 2017-07-04 DIAGNOSIS — H6692 Otitis media, unspecified, left ear: Secondary | ICD-10-CM | POA: Diagnosis not present

## 2017-07-04 LAB — POCT RAPID STREP A (OFFICE): Rapid Strep A Screen: NEGATIVE

## 2017-07-04 MED ORDER — AZITHROMYCIN 250 MG PO TABS: ORAL_TABLET | ORAL | 0 refills | Status: DC

## 2017-07-04 NOTE — Progress Notes (Signed)
Chief Complaint  Patient presents with  . sick    yesterday morning, running nose and sore throat- thought it was allergies, ear pain wiht popping, nausated,    Subjective:  Rebecca Shepherd is a 21 y.o. female who presents for a 3 day history of rhinorrhea, nasal congestion, sore throat, ears feeling clogged and left ear pain. She also reports a mild frontal headache and mild cough. . States she woke up this morning she woke up feeling nauseated. This has resolved.   Denies fever, chills, chest pain, shortness of breath, abdominal pain, V/D.   Treatment to date: antihistamines.  Denies sick contacts.  No other aggravating or relieving factors.  No other c/o.   LMP: 2 weeks ago. Birth control pills.   ROS as in subjective.   Objective: Vitals:   07/04/17 1330  BP: 110/78  Pulse: 72  Resp: 16  Temp: 98.2 F (36.8 C)  SpO2: 98%    General appearance: Alert, WD/WN, no distress, mildly ill appearing                             Skin: warm, no rash                           Head: no sinus tenderness                            Eyes: conjunctiva normal, corneas clear, PERRLA                            Ears:  Bilateral TMs with erythema and left TM is also bulging and    dull, external ear canals normal                          Nose: septum midline, turbinates swollen, with erythema and clear    discharge             Mouth/throat: MMM, tongue normal, mild pharyngeal erythema                           Neck: supple, no adenopathy, no thyromegaly, nontender                          Heart: RRR, normal S1, S2, no murmurs                         Lungs: CTA bilaterally, no wheezes, rales, or rhonchi      Assessment: Acute otitis media, left  Sore throat - Plan: POCT rapid strep A  Acute pharyngitis, unspecified etiology   Plan: Negative rapid strep.  Discussed diagnosis and treatment of otitis media and acute pharyngitis. Z-pack sent to pharmacy (pencillin allergy).  Suggested  symptomatic OTC remedies. Nasal saline spray for congestion, salt water gargles, hydrate.  Tylenol or Ibuprofen OTC for fever and malaise.  Call/return if not back to baseline 10 days after completing the antibiotic.

## 2017-07-07 ENCOUNTER — Ambulatory Visit (INDEPENDENT_AMBULATORY_CARE_PROVIDER_SITE_OTHER): Payer: 59 | Admitting: Licensed Clinical Social Worker

## 2017-07-07 DIAGNOSIS — F419 Anxiety disorder, unspecified: Secondary | ICD-10-CM

## 2017-07-18 ENCOUNTER — Other Ambulatory Visit: Payer: Self-pay | Admitting: Family Medicine

## 2017-07-18 NOTE — Telephone Encounter (Signed)
Ok to refill 

## 2017-09-05 ENCOUNTER — Encounter: Payer: Self-pay | Admitting: Family Medicine

## 2017-09-05 ENCOUNTER — Encounter: Payer: Self-pay | Admitting: Neurology

## 2017-09-05 ENCOUNTER — Ambulatory Visit (INDEPENDENT_AMBULATORY_CARE_PROVIDER_SITE_OTHER): Payer: 59 | Admitting: Family Medicine

## 2017-09-05 VITALS — BP 116/72 | HR 76 | Ht 68.0 in | Wt 204.6 lb

## 2017-09-05 DIAGNOSIS — R51 Headache: Secondary | ICD-10-CM

## 2017-09-05 DIAGNOSIS — R519 Headache, unspecified: Secondary | ICD-10-CM

## 2017-09-05 NOTE — Progress Notes (Signed)
   Subjective:    Patient ID: Rebecca Shepherd, female    DOB: Jun 17, 1996, 21 y.o.   MRN: [id number]  HPI Chief Complaint  Patient presents with  . Acute Visit    frequent headaches, 4 or 5 times weekly, go away with OTC medications   She is here with complaints of headaches occurring 4-5 times per week for the past several months.  Pain is described as throbbing and located in the occipital region mainly and occasionally in bilateral tempora regions. Onset is typically afternoon or evening and lasting 2 hours without medication and resolve with Excedrin.  No aura.  No loss of vision. Denies N/V. Questionable photophobia or phonophobia.  States she has recently been to her eye doctor.  States she has been careful with caffeine intake and no fluctuations since onset of headaches. States she has been getting adequate sleep, good amount of hydration, and does not skip meals.  States her headaches do not appear to be related to foods, menses, or any particular activity.   States she has been going to a chiropractor for neck and back pain and those symptoms have resolved but headaches are actually worse.   Seasonal allergies. No recent URI.   Has been taking OCPs since age 21 or [age]. No new medications.  Denies alcohol or drug use.   Denies fever, chills, dizziness, chest pain, palpitations, shortness of breath, abdominal pain, N/V/D, urinary symptoms, LE edema.  No numbness, tingling or weakness.   Reviewed allergies, medications, past medical, surgical, family, and social history.   Review of Systems Pertinent positives and negatives in the history of present illness.     Objective:   Physical Exam BP 116/72 (BP Location: Right Arm, Patient Position: Sitting)   Pulse 76   Ht 5\' 8"  (1.727 m)   Wt 204 lb 9.6 oz (92.8 kg)   LMP 09/01/2017   SpO2 98%   BMI 31.11 kg/m   Alert and in no distress. Tympanic membranes and canals are normal. Pharyngeal area is normal. Neck is supple  without adenopathy or thyromegaly. Cardiac exam shows a regular sinus rhythm without murmurs or gallops. Lungs are clear to auscultation. PERRLA, EOMs intact, CN II-IX intact.       Assessment & Plan:  Frequent headaches - Plan: Ambulatory referral to Neurology  Review of EMR shows that she has been having similar headaches for the past year. She has failed conservative treatment for tension type headaches. Her headaches do not sound like classic migraines.  Referral made to neurology for further evaluation

## 2017-09-06 ENCOUNTER — Telehealth: Payer: Self-pay | Admitting: Family Medicine

## 2017-09-06 NOTE — Telephone Encounter (Signed)
Please see if we can get her seen sooner for headaches with a different neurologist. Thanks.

## 2017-09-06 NOTE — Telephone Encounter (Signed)
Pt called and states that the earliest she can get in with neuro is in March. She was wanting to know if you could expedite things for her. Pt can be reached at [phone].

## 2017-09-08 ENCOUNTER — Ambulatory Visit: Payer: 59 | Admitting: Licensed Clinical Social Worker

## 2017-09-14 ENCOUNTER — Ambulatory Visit (INDEPENDENT_AMBULATORY_CARE_PROVIDER_SITE_OTHER): Payer: 59 | Admitting: Licensed Clinical Social Worker

## 2017-09-14 DIAGNOSIS — F419 Anxiety disorder, unspecified: Secondary | ICD-10-CM | POA: Diagnosis not present

## 2017-09-14 NOTE — Telephone Encounter (Signed)
[facility]. I will await a call back and if they can see her sooner I will change referral. I also let patient know what was going on .

## 2017-09-15 ENCOUNTER — Other Ambulatory Visit: Payer: Self-pay | Admitting: *Deleted

## 2017-09-15 DIAGNOSIS — R51 Headache: Principal | ICD-10-CM

## 2017-09-15 DIAGNOSIS — R519 Headache, unspecified: Secondary | ICD-10-CM

## 2017-09-15 NOTE — Telephone Encounter (Signed)
Spoke with [facility] and they are scheduling into Feb-I will resend referral to them and they will call the patient.

## 2017-10-19 ENCOUNTER — Encounter: Payer: Self-pay | Admitting: Neurology

## 2017-10-19 ENCOUNTER — Ambulatory Visit (INDEPENDENT_AMBULATORY_CARE_PROVIDER_SITE_OTHER): Payer: 59 | Admitting: Neurology

## 2017-10-19 ENCOUNTER — Encounter (INDEPENDENT_AMBULATORY_CARE_PROVIDER_SITE_OTHER): Payer: Self-pay

## 2017-10-19 VITALS — BP 133/77 | HR 83 | Ht 68.0 in | Wt 205.6 lb

## 2017-10-19 DIAGNOSIS — R51 Headache with orthostatic component, not elsewhere classified: Secondary | ICD-10-CM

## 2017-10-19 DIAGNOSIS — G43009 Migraine without aura, not intractable, without status migrainosus: Secondary | ICD-10-CM | POA: Diagnosis not present

## 2017-10-19 DIAGNOSIS — R519 Headache, unspecified: Secondary | ICD-10-CM

## 2017-10-19 MED ORDER — TOPIRAMATE ER 50 MG PO SPRINKLE CAP24: 50.0000 mg | EXTENDED_RELEASE_CAPSULE | Freq: Every day | ORAL | 11 refills | Status: DC

## 2017-10-19 MED ORDER — ONDANSETRON HCL 4 MG PO TABS: 4.0000 mg | ORAL_TABLET | Freq: Three times a day (TID) | ORAL | 11 refills | Status: DC | PRN

## 2017-10-19 MED ORDER — TOPIRAMATE ER 50 MG PO SPRINKLE CAP24: 1.0000 | EXTENDED_RELEASE_CAPSULE | Freq: Every day | ORAL | 0 refills | Status: DC

## 2017-10-19 MED ORDER — RIZATRIPTAN BENZOATE 10 MG PO TABS: 10.0000 mg | ORAL_TABLET | ORAL | 11 refills | Status: DC | PRN

## 2017-10-19 MED ORDER — TOPIRAMATE ER 25 MG PO SPRINKLE CAP24: 1.0000 | EXTENDED_RELEASE_CAPSULE | Freq: Every day | ORAL | 0 refills | Status: DC

## 2017-10-19 NOTE — Patient Instructions (Addendum)
May try the following over the counter meds: Magnesium citrate 400mg  to 600mg  daily, riboflavin 400mg  daily, Coenzyme Q 10 100mg  three times daily Consider joining Facebook group [facility].  MRI brain Labs Start Qudexy (Topiramate) 25mg  at bedtime for one -two weeks then increase to 50mg .  Email me in 4-6 weeks for updates on how yo are doing  Maxalt (Rizatriptan): Please take one tablet at the onset of your headache. If it does not improve the symptoms please take one additional tablet in 2 hours. Do not take more then 2 tablets in 24hrs. Do not take use more then 2 to 3 times in a week.  For nausea can take Ondansetron  ** Qudexy does not interfere with birth control until > 200mg  a day. Do not get pregnant.  To prevent or relieve headaches, try the following: Cool Compress. Lie down and place a cool compress on your head.  Avoid headache triggers. If certain foods or odors seem to have triggered your migraines in the past, avoid them. A headache diary might help you identify triggers.  Include physical activity in your daily routine. Try a daily walk or other moderate aerobic exercise.  Manage stress. Find healthy ways to cope with the stressors, such as delegating tasks on your to-do list.  Practice relaxation techniques. Try deep breathing, yoga, massage and visualization.  Eat regularly. Eating regularly scheduled meals and maintaining a healthy diet might help prevent headaches. Also, drink plenty of fluids.  Follow a regular sleep schedule. Sleep deprivation might contribute to headaches Consider biofeedback. With this mind-body technique, you learn to control certain bodily functions - such as muscle tension, heart rate and blood pressure - to prevent headaches or reduce headache pain.    Proceed to emergency room if you experience new or worsening symptoms or symptoms do not resolve, if you have new neurologic symptoms or if headache is severe, or for any  concerning symptom.   Provided education and documentation from American headache Society toolbox including articles on: chronic migraine medication overuse headache, chronic migraines, prevention of migraines, behavioral and other nonpharmacologic treatments for headache.  Rizatriptan tablets What is this medicine? RIZATRIPTAN (rye za TRIP tan) is used to treat migraines with or without aura. An aura is a strange feeling or visual disturbance that warns you of an attack. It is not used to prevent migraines. This medicine may be used for other purposes; ask your health care provider or pharmacist if you have questions. COMMON BRAND NAME(S): Maxalt What should I tell my health care provider before I take this medicine? They need to know if you have any of these conditions: -bowel disease or colitis -diabetes -family history of heart disease -fast or irregular heart beat -heart or blood vessel disease, angina (chest pain), or previous heart attack -high blood pressure -high cholesterol -history of stroke, transient ischemic attacks (TIAs or mini-strokes), or intracranial bleeding -kidney or liver disease -overweight -poor circulation -postmenopausal or surgical removal of uterus and ovaries -Raynaud's disease -seizure disorder -an unusual or allergic reaction to rizatriptan, other medicines, foods, dyes, or preservatives -pregnant or trying to get pregnant -breast-feeding How should I use this medicine? This medicine is taken by mouth with a glass of water. Follow the directions on the prescription label. This medicine is taken at the first symptoms of a migraine. It is not for everyday use. If your migraine headache returns after one dose, you can take another dose as directed. You must leave at least 2 hours  between doses, and do not take more than 30 mg total in 24 hours. If there is no improvement at all after the first dose, do not take a second dose without talking to your doctor or  health care professional. Do not take your medicine more often than directed. Talk to your pediatrician regarding the use of this medicine in children. While this drug may be prescribed for children as young as 6 years for selected conditions, precautions do apply. Overdosage: If you think you have taken too much of this medicine contact a poison control center or emergency room at once. NOTE: This medicine is only for you. Do not share this medicine with others. What if I miss a dose? This does not apply; this medicine is not for regular use. What may interact with this medicine? Do not take this medicine with any of the following medicines: -amphetamine, dextroamphetamine or cocaine -dihydroergotamine, ergotamine, ergoloid mesylates, methysergide, or ergot-type medication - do not take within 24 hours of taking rizatriptan -feverfew -MAOIs like Carbex, Eldepryl, Marplan, Nardil, and Parnate - do not take rizatriptan within 2 weeks of stopping MAOI therapy. -other migraine medicines like almotriptan, eletriptan, naratriptan, sumatriptan, zolmitriptan - do not take within 24 hours of taking rizatriptan -tryptophan This medicine may also interact with the following medications: -medicines for mental depression, anxiety or mood problems -propranolol This list may not describe all possible interactions. Give your health care provider a list of all the medicines, herbs, non-prescription drugs, or dietary supplements you use. Also tell them if you smoke, drink alcohol, or use illegal drugs. Some items may interact with your medicine. What should I watch for while using this medicine? Only take this medicine for a migraine headache. Take it if you get warning symptoms or at the start of a migraine attack. It is not for regular use to prevent migraine attacks. You may get drowsy or dizzy. Do not drive, use machinery, or do anything that needs mental alertness until you know how this medicine affects  you. To reduce dizzy or fainting spells, do not sit or stand up quickly, especially if you are an older patient. Alcohol can increase drowsiness, dizziness and flushing. Avoid alcoholic drinks. Smoking cigarettes may increase the risk of heart-related side effects from using this medicine. If you take migraine medicines for 10 or more days a month, your migraines may get worse. Keep a diary of headache days and medicine use. Contact your healthcare professional if your migraine attacks occur more frequently. What side effects may I notice from receiving this medicine? Side effects that you should report to your doctor or health care professional as soon as possible: -allergic reactions like skin rash, itching or hives, swelling of the face, lips, or tongue -fast, slow, or irregular heart beat -increased or decreased blood pressure -seizures -severe stomach pain and cramping, bloody diarrhea -signs and symptoms of a blood clot such as breathing problems; changes in vision; chest pain; severe, sudden headache; pain, swelling, warmth in the leg; trouble speaking; sudden numbness or weakness of the face, arm or leg -tingling, pain, or numbness in the face, hands, or feet Side effects that usually do not require medical attention (report to your doctor or health care professional if they continue or are bothersome): -drowsiness -dry mouth -feeling warm, flushing, or redness of the face -headache -muscle cramps, pain -nausea, vomiting -unusually weak or tired This list may not describe all possible side effects. Call your doctor for medical advice about side effects. You may  report side effects to FDA at 1-800-FDA-1088. Where should I keep my medicine? Keep out of the reach of children. Store at room temperature between 15 and 30 degrees C (59 and 86 degrees F). Keep container tightly closed. Throw away any unused medicine after the expiration date. NOTE: This sheet is a summary. It may not cover  all possible information. If you have questions about this medicine, talk to your doctor, pharmacist, or health care provider.  2018 Elsevier/Gold Standard (2013-05-22 10:16:39)   Topiramate extended-release capsules What is this medicine? TOPIRAMATE (toe PYRE a mate) is used to treat seizures in adults or children with epilepsy. It is also used for the prevention of migraine headaches. This medicine may be used for other purposes; ask your health care provider or pharmacist if you have questions. COMMON BRAND NAME(S): Trokendi XR What should I tell my health care provider before I take this medicine? They need to know if you have any of these conditions: -cirrhosis of the liver or liver disease -diarrhea -glaucoma -kidney stones or kidney disease -lung disease like asthma, obstructive pulmonary disease, emphysema -metabolic acidosis -on a ketogenic diet -scheduled for surgery or a procedure -suicidal thoughts, plans, or attempt; a previous suicide attempt by you or a family member -an unusual or allergic reaction to topiramate, other medicines, foods, dyes, or preservatives -pregnant or trying to get pregnant -breast-feeding How should I use this medicine? Take this medicine by mouth with a glass of water. Follow the directions on the prescription label. Trokendi XR capsules must be swallowed whole. Do not sprinkle on food, break, crush, dissolve, or chew. Qudexy XR capsules may be swallowed whole or opened and sprinkled on a small amount of soft food. This mixture must be swallowed immediately. Do not chew or store mixture for later use. You may take this medicine with meals. Take your medicine at regular intervals. Do not take it more often than directed. Talk to your pediatrician regarding the use of this medicine in children. Special care may be needed. While Trokendi XR may be prescribed for children as young as 6 years and Qudexy XR may be prescribed for children as young as 2 years  for selected conditions, precautions do apply. Overdosage: If you think you have taken too much of this medicine contact a poison control center or emergency room at once. NOTE: This medicine is only for you. Do not share this medicine with others. What if I miss a dose? If you miss a dose, take it as soon as you can. If it is almost time for your next dose, take only that dose. Do not take double or extra doses. What may interact with this medicine? Do not take this medicine with any of the following medications: -probenecid This medicine may also interact with the following medications: -acetazolamide -alcohol -amitriptyline -birth control pills -digoxin -hydrochlorothiazide -lithium -medicines for pain, sleep, or muscle relaxation -metformin -methazolamide -other seizure or epilepsy medicines -pioglitazone -risperidone This list may not describe all possible interactions. Give your health care provider a list of all the medicines, herbs, non-prescription drugs, or dietary supplements you use. Also tell them if you smoke, drink alcohol, or use illegal drugs. Some items may interact with your medicine. What should I watch for while using this medicine? Visit your doctor or health care professional for regular checks on your progress. Do not stop taking this medicine suddenly. This increases the risk of seizures if you are using this medicine to control epilepsy. Wear a medical identification  bracelet or chain to say you have epilepsy or seizures, and carry a card that lists all your medicines. This medicine can decrease sweating and increase your body temperature. Watch for signs of deceased sweating or fever, especially in children. Avoid extreme heat, hot baths, and saunas. Be careful about exercising, especially in hot weather. Contact your health care provider right away if you notice a fever or decrease in sweating. You should drink plenty of fluids while taking this medicine. If you  have had kidney stones in the past, this will help to reduce your chances of forming kidney stones. If you have stomach pain, with nausea or vomiting and yellowing of your eyes or skin, call your doctor immediately. You may get drowsy, dizzy, or have blurred vision. Do not drive, use machinery, or do anything that needs mental alertness until you know how this medicine affects you. To reduce dizziness, do not sit or stand up quickly, especially if you are an older patient. Alcohol can increase drowsiness and dizziness. Avoid alcoholic drinks. Do not drink alcohol for 6 hours before or 6 hours after taking Trokendi XR. If you notice blurred vision, eye pain, or other eye problems, seek medical attention at once for an eye exam. The use of this medicine may increase the chance of suicidal thoughts or actions. Pay special attention to how you are responding while on this medicine. Any worsening of mood, or thoughts of suicide or dying should be reported to your health care professional right away. This medicine may increase the chance of developing metabolic acidosis. If left untreated, this can cause kidney stones, bone disease, or slowed growth in children. Symptoms include breathing fast, fatigue, loss of appetite, irregular heartbeat, or loss of consciousness. Call your doctor immediately if you experience any of these side effects. Also, tell your doctor about any surgery you plan on having while taking this medicine since this may increase your risk for metabolic acidosis. Birth control pills may not work properly while you are taking this medicine. Talk to your doctor about using an extra method of birth control. Women who become pregnant while using this medicine may enroll in the [place] American Antiepileptic Drug Pregnancy Registry by calling [phone]. This registry collects information about the safety of antiepileptic drug use during pregnancy. What side effects may I notice from receiving this  medicine? Side effects that you should report to your doctor or health care professional as soon as possible: -allergic reactions like skin rash, itching or hives, swelling of the face, lips, or tongue -decreased sweating and/or rise in body temperature -depression -difficulty breathing, fast or irregular breathing patterns -difficulty speaking -difficulty walking or controlling muscle movements -hearing impairment -redness, blistering, peeling or loosening of the skin, including inside the mouth -tingling, pain or numbness in the hands or feet -unusually weak or tired -worsening of mood, thoughts or actions of suicide or dying Side effects that usually do not require medical attention (report to your doctor or health care professional if they continue or are bothersome): -altered taste -back pain, joint or muscle aches and pains -diarrhea, or constipation -headache -loss of appetite -nausea -stomach upset, indigestion -tremors This list may not describe all possible side effects. Call your doctor for medical advice about side effects. You may report side effects to FDA at 1-800-FDA-1088. Where should I keep my medicine? Keep out of the reach of children. Store at room temperature between 15 and 30 degrees C (59 and 86 degrees F) in a tightly closed container.  Protect from moisture. Throw away any unused medicine after the expiration date. NOTE: This sheet is a summary. It may not cover all possible information. If you have questions about this medicine, talk to your doctor, pharmacist, or health care provider.  2018 Elsevier/Gold Standard (2016-01-09 12:33:11)   Migraine Headache A migraine headache is an intense, throbbing pain on one side or both sides of the head. Migraines may also cause other symptoms, such as nausea, vomiting, and sensitivity to light and noise. What are the causes? Doing or taking certain things may also trigger migraines, such  as:  Alcohol.  Smoking.  Medicines, such as: ? Medicine used to treat chest pain (nitroglycerine). ? Birth control pills. ? Estrogen pills. ? Certain blood pressure medicines.  Aged cheeses, chocolate, or caffeine.  Foods or drinks that contain nitrates, glutamate, aspartame, or tyramine.  Physical activity.  Other things that may trigger a migraine include:  Menstruation.  Pregnancy.  Hunger.  Stress, lack of sleep, too much sleep, or fatigue.  Weather changes.  What increases the risk? The following factors may make you more likely to experience migraine headaches:  Age. Risk increases with age.  Family history of migraine headaches.  Being Caucasian.  Depression and anxiety.  Obesity.  Being a woman.  Having a hole in the heart (patent foramen ovale) or other heart problems.  What are the signs or symptoms? The main symptom of this condition is pulsating or throbbing pain. Pain may:  Happen in any area of the head, such as on one side or both sides.  Interfere with daily activities.  Get worse with physical activity.  Get worse with exposure to bright lights or loud noises.  Other symptoms may include:  Nausea.  Vomiting.  Dizziness.  General sensitivity to bright lights, loud noises, or smells.  Before you get a migraine, you may get warning signs that a migraine is developing (aura). An aura may include:  Seeing flashing lights or having blind spots.  Seeing bright spots, halos, or zigzag lines.  Having tunnel vision or blurred vision.  Having numbness or a tingling feeling.  Having trouble talking.  Having muscle weakness.  How is this diagnosed? A migraine headache can be diagnosed based on:  Your symptoms.  A physical exam.  Tests, such as CT scan or MRI of the head. These imaging tests can help rule out other causes of headaches.  Taking fluid from the spine (lumbar puncture) and analyzing it (cerebrospinal fluid  analysis, or CSF analysis).  How is this treated? A migraine headache is usually treated with medicines that:  Relieve pain.  Relieve nausea.  Prevent migraines from coming back.  Treatment may also include:  Acupuncture.  Lifestyle changes like avoiding foods that trigger migraines.  Follow these instructions at home: Medicines  Take over-the-counter and prescription medicines only as told by your health care provider.  Do not drive or use heavy machinery while taking prescription pain medicine.  To prevent or treat constipation while you are taking prescription pain medicine, your health care provider may recommend that you: ? Drink enough fluid to keep your urine clear or pale yellow. ? Take over-the-counter or prescription medicines. ? Eat foods that are high in fiber, such as fresh fruits and vegetables, whole grains, and beans. ? Limit foods that are high in fat and processed sugars, such as fried and sweet foods. Lifestyle  Avoid alcohol use.  Do not use any products that contain nicotine or tobacco, such as cigarettes and  e-cigarettes. If you need help quitting, ask your health care provider.  Get at least 8 hours of sleep every night.  Limit your stress. General instructions   Keep a journal to find out what may trigger your migraine headaches. For example, write down: ? What you eat and drink. ? How much sleep you get. ? Any change to your diet or medicines.  If you have a migraine: ? Avoid things that make your symptoms worse, such as bright lights. ? It may help to lie down in a dark, quiet room. ? Do not drive or use heavy machinery. ? Ask your health care provider what activities are safe for you while you are experiencing symptoms.  Keep all follow-up visits as told by your health care provider. This is important. Contact a health care provider if:  You develop symptoms that are different or more severe than your usual migraine symptoms. Get help  right away if:  Your migraine becomes severe.  You have a fever.  You have a stiff neck.  You have vision loss.  Your muscles feel weak or like you cannot control them.  You start to lose your balance often.  You develop trouble walking.  You faint. This information is not intended to replace advice given to you by your health care provider. Make sure you discuss any questions you have with your health care provider. Document Released: 09/20/2005 Document Revised: 04/09/2016 Document Reviewed: 03/08/2016 Elsevier Interactive Patient Education  2017 [employer].

## 2017-10-19 NOTE — Progress Notes (Signed)
[facility]    Provider:  Dr Lucia Gaskins Referring Provider: Avanell Shackleton, NP-C Primary Care Physician:  Avanell Shackleton, NP-C  CC:  Intractable headache  HPI:  Rebecca Shepherd is a 22 y.o. female here as a referral from Dr. Suezanne Jacquet for headaches. Mother has migraines. She gets them 4-5 times a week. Can go away with OTC meds like advil. Started 4 months ago. Worsenings, used be less frequent. Last 2 hours if treated. They are in the occipital area, feels like throbbing pain, sometimes dull. Rarely in the temples. A dark room helps. Sitting quietly and still helps. The past semester has been more stressful. Unknown triggers. No associated with foods or anything she can think of. Stress and tension can make it worse. She can wake up with headaches as well, severe headaches. They can be severe an have been 8-9/10 but often 5/10 and can be functional. They can start later in the day. She goes to a chiropractor for slouching and muscle tightness. Doesn't think her headaches are related to the muscle tension. If she doesn't use anything can have nausea, no vomiting. She sleeps well, 11-7.   Reviewed notes, labs and imaging from outside physicians, which showed:  Reviewed referring physician notes.  She complains of headaches occurring 4-5 times per week for the last several months pain is described as throbbing and located in the occipital region occasionally bilaterally temporal.  Onset is typically afternoon or evening and last 2 hours without medication and resolves with Excedrin.  No aura.  No loss of vision.  No nausea vomiting.  She recently saw the eye doctor.  No caffeine overuse.  She gets a good amount of sleep and hydration.    Review of Systems: Patient complains of symptoms per HPI as well as the following symptoms: headaches. Pertinent negatives and positives per HPI. All others negative.   Social History   Socioeconomic History  . Marital status: Single    Spouse  name: Not on file  . Number of children: Not on file  . Years of education: Not on file  . Highest education level: Not on file  Social Needs  . Financial resource strain: Not on file  . Food insecurity - worry: Not on file  . Food insecurity - inability: Not on file  . Transportation needs - medical: Not on file  . Transportation needs - non-medical: Not on file  Occupational History  . Not on file  Tobacco Use  . Smoking status: Never Smoker  . Smokeless tobacco: Never Used  Substance and Sexual Activity  . Alcohol use: No  . Drug use: No  . Sexual activity: No    Comment: never   Other Topics Concern  . Not on file  Social History Narrative  . Not on file    Family History  Problem Relation Age of Onset  . Diabetes Other   . Hypertension Other   . Stroke Other     No past medical history on file.  Past Surgical History:  Procedure Laterality Date  . APPENDECTOMY    . LAPAROSCOPIC APPENDECTOMY N/A 11/14/2015   Procedure: APPENDECTOMY LAPAROSCOPIC;  Surgeon: Emelia Loron, MD;  Location: [facility] OR;  Service: General;  Laterality: N/A;  . TONSILLECTOMY    . TONSILLECTOMY AND ADENOIDECTOMY      Current Outpatient Medications  Medication Sig Dispense Refill  . albuterol (PROVENTIL HFA;VENTOLIN HFA) 108 (90 Base) MCG/ACT inhaler TAKE 2 PUFFS BY MOUTH EVERY 6 HOURS AS NEEDED  FOR WHEEZE OR SHORTNESS OF BREATH 8.5 Inhaler 0  . cetirizine (ZYRTEC) 10 MG tablet Take 10 mg by mouth daily.     . Multiple Vitamins-Minerals (WOMENS DAILY FORMULA PO) Take 1 tablet by mouth daily.     . Norethindrone-Ethinyl Estradiol-Fe Biphas (LO LOESTRIN FE) 1 MG-10 MCG / 10 MCG tablet Take 1 tablet by mouth daily.    . ondansetron (ZOFRAN) 4 MG tablet Take 1 tablet (4 mg total) by mouth every 8 (eight) hours as needed for nausea or vomiting. 20 tablet 11  . rizatriptan (MAXALT) 10 MG tablet Take 1 tablet (10 mg total) by mouth as needed for migraine. May repeat in 2 hours if needed 10 tablet  11  . Topiramate ER (QUDEXY XR) 50 MG CS24 sprinkle capsule Take 50 mg by mouth at bedtime. 30 each 11   No current facility-administered medications for this visit.     Allergies as of 10/19/2017 - Review Complete 10/19/2017  Allergen Reaction Noted  . Augmentin [amoxicillin-pot clavulanate] Hives 11/14/2015    Vitals: BP 133/77   Pulse 83   Ht 5\' 8"  (1.727 m)   Wt 205 lb 9.6 oz (93.3 kg)   BMI 31.26 kg/m  Last Weight:  Wt Readings from Last 1 Encounters:  10/19/17 205 lb 9.6 oz (93.3 kg)   Last Height:   Ht Readings from Last 1 Encounters:  10/19/17 5\' 8"  (1.727 m)    Physical exam: Exam: Gen: NAD, conversant, well nourised, well groomed                     CV: RRR, no MRG. No Carotid Bruits. No peripheral edema, warm, nontender Eyes: Conjunctivae clear without exudates or hemorrhage  Neuro: Detailed Neurologic Exam  Speech:    Speech is normal; fluent and spontaneous with normal comprehension.  Cognition:    The patient is oriented to person, place, and time;     recent and remote memory intact;     language fluent;     normal attention, concentration,     fund of knowledge Cranial Nerves:    The pupils are equal, round, and reactive to light. The fundi are normal and spontaneous venous pulsations are present. Visual fields are full to finger confrontation. Extraocular movements are intact. Trigeminal sensation is intact and the muscles of mastication are normal. The face is symmetric. The palate elevates in the midline. Hearing intact. Voice is normal. Shoulder shrug is normal. The tongue has normal motion without fasciculations.   Coordination:    Normal finger to nose and heel to shin. Normal rapid alternating movements.   Gait:    Heel-toe and tandem gait are normal.   Motor Observation:    No asymmetry, no atrophy, and no involuntary movements noted. Tone:    Normal muscle tone.    Posture:    Posture is normal. normal erect    Strength:     Strength is V/V in the upper and lower limbs.      Sensation: intact to LT     Reflex Exam:  DTR's:    Deep tendon reflexes in the upper and lower extremities are normal bilaterally.   Toes:    The toes are downgoing bilaterally.   Clonus:    Clonus is absent.      Assessment/Plan:  22 year old with new onset occipital headaches, positional in quality, intractable. Probable new onset migraine  - Given occipital and positional quality, need MRI brain to ensure no space-occupying  masses or chiari or other intracranial etiologies.  - Magnesium citrate 400mg  to 600mg  daily, riboflavin 400mg  daily, Coenzyme Q 10 100mg  three times daily Consider joining Facebook group [facility].  - Start Qudexy (Topiramate) 25mg  at bedtime for one -two weeks then increase to 50mg .  Maxalt (Rizatriptan): Please take one tablet at the onset of your headache. If it does not improve the symptoms please take one additional tablet. Do not take more then 2 tablets in 24hrs. Do not take use more then 2 to 3 times in a week.  For nausea can take Ondansetron  Discussed: To prevent or relieve headaches, try the following: Cool Compress. Lie down and place a cool compress on your head.  Avoid headache triggers. If certain foods or odors seem to have triggered your migraines in the past, avoid them. A headache diary might help you identify triggers.  Include physical activity in your daily routine. Try a daily walk or other moderate aerobic exercise.  Manage stress. Find healthy ways to cope with the stressors, such as delegating tasks on your to-do list.  Practice relaxation techniques. Try deep breathing, yoga, massage and visualization.  Eat regularly. Eating regularly scheduled meals and maintaining a healthy diet might help prevent headaches. Also, drink plenty of fluids.  Follow a regular sleep schedule. Sleep deprivation might contribute to headaches Consider biofeedback. With this  mind-body technique, you learn to control certain bodily functions - such as muscle tension, heart rate and blood pressure - to prevent headaches or reduce headache pain.    Proceed to emergency room if you experience new or worsening symptoms or symptoms do not resolve, if you have new neurologic symptoms or if headache is severe, or for any concerning symptom.   Provided education and documentation from American headache Society toolbox including articles on: chronic migraine medication overuse headache, chronic migraines, prevention of migraines, behavioral and other nonpharmacologic treatments for headache.  Orders Placed This Encounter  Procedures  . MR BRAIN W WO CONTRAST  . CBC  . Comprehensive metabolic panel  . TSH      Naomie DeanAntonia Kc Summerson, MD  [facility] [address] [place], [place] [id number]  Phone [phone] Fax [phone]

## 2017-10-20 ENCOUNTER — Telehealth: Payer: Self-pay | Admitting: *Deleted

## 2017-10-20 LAB — CBC
HEMATOCRIT: 38.5 % (ref 34.0–46.6)
Hemoglobin: 13.2 g/dL (ref 11.1–15.9)
MCH: 30.4 pg (ref 26.6–33.0)
MCHC: 34.3 g/dL (ref 31.5–35.7)
MCV: 89 fL (ref 79–97)
PLATELETS: 216 10*3/uL (ref 150–379)
RBC: 4.34 x10E6/uL (ref 3.77–5.28)
RDW: 13.2 % (ref 12.3–15.4)
WBC: 5.3 10*3/uL (ref 3.4–10.8)

## 2017-10-20 LAB — COMPREHENSIVE METABOLIC PANEL
ALK PHOS: 63 IU/L (ref 39–117)
ALT: 25 IU/L (ref 0–32)
AST: 21 IU/L (ref 0–40)
Albumin/Globulin Ratio: 1.6 (ref 1.2–2.2)
Albumin: 4.5 g/dL (ref 3.5–5.5)
BUN/Creatinine Ratio: 14 (ref 9–23)
BUN: 10 mg/dL (ref 6–20)
Bilirubin Total: 0.4 mg/dL (ref 0.0–1.2)
CO2: 23 mmol/L (ref 20–29)
CREATININE: 0.7 mg/dL (ref 0.57–1.00)
Calcium: 9.7 mg/dL (ref 8.7–10.2)
Chloride: 105 mmol/L (ref 96–106)
GFR calc Af Amer: 144 mL/min/{1.73_m2} (ref >59)
GFR calc non Af Amer: 125 mL/min/{1.73_m2} (ref >59)
GLOBULIN, TOTAL: 2.9 g/dL (ref 1.5–4.5)
Glucose: 82 mg/dL (ref 65–99)
POTASSIUM: 4.2 mmol/L (ref 3.5–5.2)
SODIUM: 141 mmol/L (ref 134–144)
Total Protein: 7.4 g/dL (ref 6.0–8.5)

## 2017-10-20 LAB — TSH: TSH: 2.02 u[IU]/mL (ref 0.450–4.500)

## 2017-10-20 NOTE — Telephone Encounter (Signed)
-----   Message from Anson FretAntonia B Ahern, MD sent at 10/20/2017 10:34 AM EST ----- Labs normal

## 2017-10-20 NOTE — Telephone Encounter (Signed)
Called patient to tell her that her labs are normal. If she calls back, please relay the message.

## 2017-10-21 NOTE — Telephone Encounter (Signed)
Called patient to discuss lab results. VM box full.

## 2017-10-24 ENCOUNTER — Encounter: Payer: Self-pay | Admitting: *Deleted

## 2017-10-24 ENCOUNTER — Other Ambulatory Visit: Payer: Self-pay | Admitting: Family Medicine

## 2017-10-24 NOTE — Telephone Encounter (Signed)
Is this okay to refill? 

## 2017-10-24 NOTE — Telephone Encounter (Signed)
Tried to call patient again.VM box full. Will send letter.

## 2017-10-24 NOTE — Telephone Encounter (Signed)
Let her know that Rebecca Shepherd is out of the office this week   I received a refill request on albuterol inhaler.  It appears that this was sent out in October but I do not see any documentation regarding this or history of asthma.  If she is having trouble breathing or asthma problems not controlled then recommend she come in for a visit.  However if she does have a history of asthma is just needing a refill then that is fine, send it in.

## 2017-10-25 NOTE — Telephone Encounter (Signed)
Pt returned RN's call. I relayed the msg, she understood and had no questions.  FYI

## 2017-10-26 ENCOUNTER — Encounter (INDEPENDENT_AMBULATORY_CARE_PROVIDER_SITE_OTHER): Payer: Self-pay

## 2017-10-26 ENCOUNTER — Ambulatory Visit: Payer: 59

## 2017-10-26 DIAGNOSIS — R51 Headache with orthostatic component, not elsewhere classified: Secondary | ICD-10-CM

## 2017-10-26 DIAGNOSIS — R519 Headache, unspecified: Secondary | ICD-10-CM

## 2017-10-26 MED ORDER — GADOPENTETATE DIMEGLUMINE 469.01 MG/ML IV SOLN: 20.0000 mL | Freq: Once | INTRAVENOUS | Status: AC | PRN

## 2017-10-27 ENCOUNTER — Telehealth: Payer: Self-pay | Admitting: *Deleted

## 2017-10-27 NOTE — Telephone Encounter (Addendum)
Attempted to call patient but vm box was full. If she calls back, please tell her that MRI of her brain is normal.    ----- Message from Anson FretAntonia B Ahern, MD sent at 10/27/2017 12:24 PM EST ----- MRI brain normal thanks

## 2017-10-27 NOTE — Telephone Encounter (Signed)
Pt returned RN's call. Msg relayed, pt understood and had no questions.  FYI

## 2017-11-23 ENCOUNTER — Encounter: Payer: Self-pay | Admitting: Family Medicine

## 2017-11-23 ENCOUNTER — Ambulatory Visit (INDEPENDENT_AMBULATORY_CARE_PROVIDER_SITE_OTHER): Payer: 59 | Admitting: Family Medicine

## 2017-11-23 VITALS — BP 120/80 | HR 74 | Temp 98.5°F | Wt 193.2 lb

## 2017-11-23 DIAGNOSIS — L03011 Cellulitis of right finger: Secondary | ICD-10-CM | POA: Diagnosis not present

## 2017-11-23 MED ORDER — SULFAMETHOXAZOLE-TRIMETHOPRIM 800-160 MG PO TABS: 1.0000 | ORAL_TABLET | Freq: Two times a day (BID) | ORAL | 0 refills | Status: DC

## 2017-11-23 NOTE — Patient Instructions (Signed)
Paronychia Paronychia is an infection of the skin that surrounds a nail. It usually affects the skin around a fingernail, but it may also occur near a toenail. It often causes pain and swelling around the nail. This condition may come on suddenly or develop over a longer period. In some cases, a collection of pus (abscess) can form near or under the nail. Usually, paronychia is not serious and it clears up with treatment. What are the causes? This condition may be caused by bacteria or fungi. It is commonly caused by either Streptococcus or Staphylococcus bacteria. The bacteria or fungi often cause the infection by getting into the affected area through an opening in the skin, such as a cut or a hangnail. What increases the risk? This condition is more likely to develop in:  People who get their hands wet often, such as those who work as dishwashers, bartenders, or nurses.  People who bite their fingernails or suck their thumbs.  People who trim their nails too short.  People who have hangnails or injured fingertips.  People who get manicures.  People who have diabetes.  What are the signs or symptoms? Symptoms of this condition include:  Redness and swelling of the skin near the nail.  Tenderness around the nail when you touch the area.  Pus-filled bumps under the cuticle. The cuticle is the skin at the base or sides of the nail.  Fluid or pus under the nail.  Throbbing pain in the area.  How is this diagnosed? This condition is usually diagnosed with a physical exam. In some cases, a sample of pus may be taken from an abscess to be tested in a lab. This can help to determine what type of bacteria or fungi is causing the condition. How is this treated? Treatment for this condition depends on the cause and severity of the condition. If the condition is mild, it may clear up on its own in a few days. Your health care provider may recommend soaking the affected area in warm water a  few times a day. When treatment is needed, the options may include:  Antibiotic medicine, if the condition is caused by a bacterial infection.  Antifungal medicine, if the condition is caused by a fungal infection.  Incision and drainage, if an abscess is present. In this procedure, the health care provider will cut open the abscess so the pus can drain out.  Follow these instructions at home:  Soak the affected area in warm water if directed to do so by your health care provider. You may be told to do this for 20 minutes, 2-3 times a day. Keep the area dry in between soakings.  Take medicines only as directed by your health care provider.  If you were prescribed an antibiotic medicine, finish all of it even if you start to feel better.  Keep the affected area clean.  Do not try to drain a fluid-filled bump yourself.  If you will be washing dishes or performing other tasks that require your hands to get wet, wear rubber gloves. You should also wear gloves if your hands might come in contact with irritating substances, such as cleaners or chemicals.  Follow your health care provider's instructions about: ? Wound care. ? Bandage (dressing) changes and removal. Contact a health care provider if:  Your symptoms get worse or do not improve with treatment.  You have a fever or chills.  You have redness spreading from the affected area.  You have continued   or increased fluid, blood, or pus coming from the affected area.  Your finger or knuckle becomes swollen or is difficult to move. This information is not intended to replace advice given to you by your health care provider. Make sure you discuss any questions you have with your health care provider. Document Released: 03/16/2001 Document Revised: 02/26/2016 Document Reviewed: 08/28/2014 Elsevier Interactive Patient Education  2018 Elsevier Inc.  

## 2017-11-23 NOTE — Progress Notes (Signed)
   Subjective:    Patient ID: Rebecca Shepherd, female    DOB: 1996/03/24, 22 y.o.   MRN: [id number]  HPI Chief Complaint  Patient presents with  . finger    hang nail infected on right finger   She is here with complaints of swelling, redness and pain to the tip of her right middle finger for the past 2-3 days.   States she bites her nails. Denies history of same. No history of MRSA or diabetes.   Denies fever, chills, N/V.   Takes OCPs. Denies chance of pregnancy.   Reviewed allergies, medications, past medical, surgical, family, and social history.    Review of Systems Pertinent positives and negatives in the history of present illness.     Objective:   Physical Exam BP 120/80   Pulse 74   Temp 98.5 F (36.9 C) (Oral)   Wt 193 lb 3.2 oz (87.6 kg)   SpO2 97%   BMI 29.38 kg/m   Right 3rd finger distal phalanx with medial nail redness, warmth, swelling and tenderness. No discoloration of the nail plate. Normal sensation, cap refill and motor function.       Assessment & Plan:  Paronychia of finger of right hand - Plan: sulfamethoxazole-trimethoprim (BACTRIM DS,SEPTRA DS) 800-160 MG tablet  Unilateral paronychia.  The finger was cleaned with saline and betadine. A #11 blade was used to incise along the cuticle margin while anesthetizing the area with ethyl chloride spray. A significant amount of purulent drainage was expelled and she tolerated this well. The area was then cleaned and bacitracin and a bandaid was applied.  Discussed treatment at home including ibuprofen and soaks.  She will take Bactrim for infection.  Follow up if not back to baseline after completing the antibiotic or if she worsens.

## 2017-11-30 ENCOUNTER — Emergency Department (HOSPITAL_COMMUNITY)
Admission: EM | Admit: 2017-11-30 | Discharge: 2017-11-30 | Disposition: A | Discharge status: Home / Self Care | Payer: 59 | Attending: Emergency Medicine | Admitting: Emergency Medicine

## 2017-11-30 ENCOUNTER — Encounter: Payer: Self-pay | Admitting: Family Medicine

## 2017-11-30 ENCOUNTER — Other Ambulatory Visit: Payer: Self-pay

## 2017-11-30 ENCOUNTER — Ambulatory Visit (INDEPENDENT_AMBULATORY_CARE_PROVIDER_SITE_OTHER): Payer: 59 | Admitting: Family Medicine

## 2017-11-30 VITALS — BP 124/80 | HR 127 | Temp 103.0°F | Resp 22 | Wt 190.8 lb

## 2017-11-30 DIAGNOSIS — J111 Influenza due to unidentified influenza virus with other respiratory manifestations: Secondary | ICD-10-CM | POA: Diagnosis not present

## 2017-11-30 DIAGNOSIS — R509 Fever, unspecified: Secondary | ICD-10-CM | POA: Diagnosis not present

## 2017-11-30 DIAGNOSIS — R000 Tachycardia, unspecified: Secondary | ICD-10-CM

## 2017-11-30 DIAGNOSIS — R0682 Tachypnea, not elsewhere classified: Secondary | ICD-10-CM | POA: Diagnosis not present

## 2017-11-30 DIAGNOSIS — R0602 Shortness of breath: Secondary | ICD-10-CM

## 2017-11-30 DIAGNOSIS — Z79899 Other long term (current) drug therapy: Secondary | ICD-10-CM | POA: Insufficient documentation

## 2017-11-30 DIAGNOSIS — R52 Pain, unspecified: Secondary | ICD-10-CM | POA: Diagnosis not present

## 2017-11-30 LAB — POCT URINALYSIS DIP (PROADVANTAGE DEVICE)
BILIRUBIN UA: NEGATIVE
Glucose, UA: NEGATIVE mg/dL
Ketones, POC UA: NEGATIVE mg/dL
Leukocytes, UA: NEGATIVE
NITRITE UA: NEGATIVE
RBC UA: NEGATIVE
Specific Gravity, Urine: 1.02
pH, UA: 6 (ref 5.0–8.0)

## 2017-11-30 LAB — POC INFLUENZA A&B (BINAX/QUICKVUE)
INFLUENZA B, POC: POSITIVE — AB
Influenza A, POC: POSITIVE — AB

## 2017-11-30 LAB — POCT URINE PREGNANCY: PREG TEST UR: NEGATIVE

## 2017-11-30 MED ORDER — PROMETHAZINE-DM 6.25-15 MG/5ML PO SYRP: 5.0000 mL | ORAL_SOLUTION | Freq: Four times a day (QID) | ORAL | 0 refills | Status: DC | PRN

## 2017-11-30 MED ORDER — ONDANSETRON HCL 4 MG PO TABS: 4.0000 mg | ORAL_TABLET | Freq: Three times a day (TID) | ORAL | 0 refills | Status: DC | PRN

## 2017-11-30 MED ORDER — ONDANSETRON HCL 4 MG/2ML IJ SOLN
4.0000 mg | Freq: Once | INTRAMUSCULAR | Status: AC
  Administered 2017-11-30: 4 mg via INTRAVENOUS
  Filled 2017-11-30: qty 2

## 2017-11-30 NOTE — Progress Notes (Signed)
Chief Complaint  Patient presents with  . Sick    sick- throwing up, headache, sinus drainage, nausea, body aches, fever, congested. since monday    Subjective:  Rebecca Shepherd is a 22 y.o. female who presents for a 2 day history of fever, chills, body aches, headache, rhinorrhea, nasal congestion, chest congestion and dry cough she also reports nausea and 3-4 episodes of vomiting wiht the last one at 1 am this morning.  No medication today.   Also complains of urinary urgency.   Denies ear pain, sore throat, chest pain, palpitations, abdominal pain, diarrhea.   States she spotted like her period might be starting but she has already had her period this month.   ?sick contacts.  No other aggravating or relieving factors.  No other c/o.  ROS as in subjective.   Objective: Vitals:   11/30/17 1026 11/30/17 1112  BP: 124/80   Pulse: (!) 142 (!) 127  Resp: 16 (!) 22  Temp: (!) 102.9 F (39.4 C) (!) 103 F (39.4 C)  SpO2: 99% 98%    General appearance: Alert, WD/WN, no distress, mildly ill appearing                             Skin: hot, no rash                           Head: + frontal and maxillary sinus tenderness                            Eyes: conjunctiva red, corneas clear, PERRLA                            Ears: pearly TMs, external ear canals normal                          Nose: septum midline, turbinates swollen, with erythema and thick discharge             Mouth/throat: MMM, tongue normal, mild pharyngeal erythema                           Neck: supple, no adenopathy, no thyromegaly, nontender                          Heart: tachycardia, normal S1, S2, no murmurs                         Lungs: shallow breathes (states she cannot take a deep breath due), tachypnea, increased work of breathing       Assessment: Influenza - Plan: POCT Urinalysis DIP (Proadvantage Device), POCT urine pregnancy  Fever, unspecified fever cause - Plan: POC Influenza  A&B(BINAX/QUICKVUE)  Body aches - Plan: POC Influenza A&B(BINAX/QUICKVUE)  Tachypnea - Plan: EKG 12-Lead  Shortness of breath - Plan: EKG 12-Lead  Tachycardia - Plan: EKG 12-Lead    Plan: ECG- NSR, tachycardia UPT negative. UA- negative. Spec grav 1.020 Flu test is inconclusive but appears to be positive for A and B. This was repeated in our office and same results appeared.  Fever did not respond with 1,000 mg of Tylenol given at 11 am. Heart rate has improved from 146 to  127. She is not in respiratory distress but respirations have increased. Plan to send her to the ED for further evaluation and hydration.  She may also have acute sinusitis.  She does not feel that she can drive herself to the ED. Will send her via ambulance.

## 2017-11-30 NOTE — ED Triage Notes (Signed)
Pt arrived via ems from PCP office for Flu like symptoms. Pt had Temp of 105 pt was given 1000mg  of Tylenol and fever reduced to 103. Pt was given 500cc of NS by EMS CBG 116 BP 116/72, O2 99% , HR 112 20G left hand PCP office did flu swab and test was inconclusive.    Pt current temp 98.6

## 2017-11-30 NOTE — ED Provider Notes (Addendum)
[facility]-EMERGENCY DEPT Provider Note   CSN: [id number] Arrival date & time: 11/30/17  1206     History   Chief Complaint Chief Complaint  Patient presents with  . Fever  . flu like symptoms    HPI Rebecca Shepherd is a 22 y.o. female presenting for evaluation of fever, nasal congestion, cough, and generalized body aches.  Patient reports she has had fever, chills, generalized body aches the past 2 days.  She has a nonproductive cough, worse at night.  She has nasal congestion.  She has associated nausea with vomiting.  She denies ear pain, eye pain, sore throat, chest pain, current shortness of breath, abdominal pain, urinary symptoms, abnormal bowel movements.  She was evaluated at her doctor's office this morning where she was tested for the flu, both flu a and B came back positive.  She was given Tylenol, which did not improve her fever at the time.  She was tachycardic and short of breath.  She was given 500 cc of fluid via EMS.  Pt states she feels better now.  She has a history of frequent sinus infections, takes allergy medicine every day.  She denies sick contacts, but later states her dad had the flu 2 weeks ago. She did not get her flu shot this year.   HPI  Past Medical History:  Diagnosis Date  . Overweight     Patient Active Problem List   Diagnosis Date Noted  . Anxiety 09/15/2016  . Elevated LFTs 09/15/2016    Past Surgical History:  Procedure Laterality Date  . APPENDECTOMY    . LAPAROSCOPIC APPENDECTOMY N/A 11/14/2015   Procedure: APPENDECTOMY LAPAROSCOPIC;  Surgeon: Emelia Loron, MD;  Location: [facility] OR;  Service: General;  Laterality: N/A;  . TONSILLECTOMY    . TONSILLECTOMY AND ADENOIDECTOMY      OB History    No data available       Home Medications    Prior to Admission medications   Medication Sig Start Date End Date Taking? Authorizing Provider  albuterol (PROVENTIL HFA;VENTOLIN HFA) 108 (90 Base) MCG/ACT inhaler TAKE  2 PUFFS BY MOUTH EVERY 6 HOURS AS NEEDED FOR WHEEZE 10/24/17   Tysinger, Kermit Balo, PA-C  cetirizine (ZYRTEC) 10 MG tablet Take 10 mg by mouth daily.     [provider]  Multiple Vitamins-Minerals (WOMENS DAILY FORMULA PO) Take 1 tablet by mouth daily.     [provider]  Norethindrone-Ethinyl Estradiol-Fe Biphas (LO LOESTRIN FE) 1 MG-10 MCG / 10 MCG tablet Take 1 tablet by mouth daily.    [provider]  ondansetron (ZOFRAN) 4 MG tablet Take 1 tablet (4 mg total) by mouth every 8 (eight) hours as needed for nausea or vomiting. 11/30/17   Cherree Conerly, PA-C  promethazine-dextromethorphan (PROMETHAZINE-DM) 6.25-15 MG/5ML syrup Take 5 mLs by mouth 4 (four) times daily as needed for cough. 11/30/17   Chandlar Guice, PA-C  rizatriptan (MAXALT) 10 MG tablet Take 1 tablet (10 mg total) by mouth as needed for migraine. May repeat in 2 hours if needed 10/19/17   Anson Fret, MD  Topiramate ER (QUDEXY XR) 50 MG CS24 sprinkle capsule Take 50 mg by mouth at bedtime. 10/19/17   Anson Fret, MD    Family History Family History  Problem Relation Age of Onset  . Diabetes Other   . Hypertension Other   . Stroke Other   . Migraines Mother     Social History Social History   Tobacco  Use  . Smoking status: Never Smoker  . Smokeless tobacco: Never Used  Substance Use Topics  . Alcohol use: No  . Drug use: No     Allergies   Augmentin [amoxicillin-pot clavulanate]   Review of Systems Review of Systems  Constitutional: Positive for chills and fever.  HENT: Positive for congestion, sinus pressure and sinus pain.   Respiratory: Positive for cough.   Gastrointestinal: Positive for nausea and vomiting.  Musculoskeletal: Positive for myalgias.  All other systems reviewed and are negative.    Physical Exam Updated Vital Signs BP 112/72 (BP Location: Right Arm)   Pulse 93   Temp 98.7 F (37.1 C) (Oral)   Resp 16   Ht 5\' 8"  (1.727 m)   Wt 86.2 kg (190  lb)   LMP 11/16/2017   SpO2 100%   BMI 28.89 kg/m   Physical Exam  Constitutional: She is oriented to person, place, and time. She appears well-developed and well-nourished. No distress.  Pt appears uncomfortable, but in NAD  HENT:  Head: Normocephalic and atraumatic.  Right Ear: Tympanic membrane, external ear and ear canal normal.  Left Ear: Tympanic membrane, external ear and ear canal normal.  Nose: Mucosal edema present. Right sinus exhibits no maxillary sinus tenderness and no frontal sinus tenderness. Left sinus exhibits no maxillary sinus tenderness and no frontal sinus tenderness.  Mouth/Throat: Uvula is midline and mucous membranes are normal. Posterior oropharyngeal erythema present. No oropharyngeal exudate, posterior oropharyngeal edema or tonsillar abscesses. No tonsillar exudate.  Nasal mucosal edema.  OP erythematous without edema or exudate.  Uvula midline with equal palate rise.  TMs nonerythematous and not bulging bilaterally.  Eyes: Conjunctivae and EOM are normal. Pupils are equal, round, and reactive to light.  Neck: Normal range of motion.  Cardiovascular: Normal rate, regular rhythm and intact distal pulses.  Pulmonary/Chest: Effort normal and breath sounds normal. She has no decreased breath sounds. She has no wheezes. She has no rhonchi. She has no rales.  Pt speaking in full sentences without difficulty. Clear lung sounds in all fields  Abdominal: Soft. She exhibits no distension and no mass. There is no tenderness. There is no guarding.  Musculoskeletal: Normal range of motion.  Lymphadenopathy:    She has cervical adenopathy.  Neurological: She is alert and oriented to person, place, and time.  Skin: Skin is warm.  Psychiatric: She has a normal mood and affect.  Nursing note and vitals reviewed.    ED Treatments / Results  Labs (all labs ordered are listed, but only abnormal results are displayed) Labs Reviewed - No data to display  EKG  EKG  Interpretation None       EKG Interpretation  Date/Time: 11/30/17   Ventricular Rate: 122    PR Interval: 134   QRS Duration:  94   QT Interval: 314   QTC Calculation:  447   R Axis:  35   Text Interpretation:  Sinus tach     Radiology No results found.  Procedures Procedures (including critical care time)  Medications Ordered in ED Medications  ondansetron (ZOFRAN) injection 4 mg (4 mg Intravenous Given 11/30/17 1406)     Initial Impression / Assessment and Plan / ED Course  I have reviewed the triage vital signs and the nursing notes.  Pertinent labs & imaging results that were available during my care of the patient were reviewed by me and considered in my medical decision making (see chart for details).     Patient presenting  with 2 days f/u like symptoms.  Physical exam reassuring, patient is currently afebrile and appears nontoxic. Temperature and HR improved with tylenol and fluids PTA.  Pulmonary exam reassuring.  Doubt pneumonia, strep, other bacterial infection, or peritonsillar abscess. Flu swab at PCPs positive for A and B, likely flu. Will give zofran and PO challenge and reassess.   Pt nausea improve with zofran and tolerating PO without difficulty.  Likely flu.  Will treat symptomatically.  Patient to follow-up with primary care as needed.  At this time, patient appears safe for discharge.  Return precautions given.  Patient states she understands and agrees to plan.  Final Clinical Impressions(s) / ED Diagnoses   Final diagnoses:  Influenza    ED Discharge Orders        Ordered    ondansetron (ZOFRAN) 4 MG tablet  Every 8 hours PRN     11/30/17 1457    promethazine-dextromethorphan (PROMETHAZINE-DM) 6.25-15 MG/5ML syrup  4 times daily PRN     11/30/17 1457       Hiroshi Krummel, PA-C 11/30/17 1522    Terrilee FilesButler, Michael C, MD 12/01/17 0817    Alveria Apleyaccavale, Deisha Stull, PA-C 12/16/17 [id number]    Terrilee FilesButler, Michael C, MD 12/17/17 1945

## 2017-11-30 NOTE — Discharge Instructions (Signed)
You likely have the flu, which is a viral illness.  This should be treated symptomatically. Use Tylenol or ibuprofen as needed for fevers or body aches. Use Flonase daily for nasal congestion and cough. Take zofran as needed for nausea or vomiting.  Use cough syrup as needed.  Make sure you stay well-hydrated with water. Wash your hands frequently to prevent spread of infection. Follow-up with your primary care doctor in 1 week if your symptoms are not improving. Return to the emergency room if you develop chest pain, difficulty breathing, or any new or worsening symptoms.

## 2017-12-01 ENCOUNTER — Encounter (HOSPITAL_COMMUNITY): Payer: Self-pay | Admitting: Emergency Medicine

## 2017-12-01 ENCOUNTER — Telehealth: Payer: Self-pay | Admitting: Family Medicine

## 2017-12-01 ENCOUNTER — Emergency Department (HOSPITAL_COMMUNITY)
Admission: EM | Admit: 2017-12-01 | Discharge: 2017-12-01 | Disposition: A | Discharge status: Home / Self Care | Payer: 59 | Attending: Emergency Medicine | Admitting: Emergency Medicine

## 2017-12-01 DIAGNOSIS — L299 Pruritus, unspecified: Secondary | ICD-10-CM | POA: Diagnosis not present

## 2017-12-01 DIAGNOSIS — R21 Rash and other nonspecific skin eruption: Secondary | ICD-10-CM | POA: Diagnosis not present

## 2017-12-01 DIAGNOSIS — Z79899 Other long term (current) drug therapy: Secondary | ICD-10-CM | POA: Insufficient documentation

## 2017-12-01 NOTE — ED Provider Notes (Signed)
[facility]-EMERGENCY DEPT Provider Note   CSN: [id number] Arrival date & time: 12/01/17  1742     History   Chief Complaint Chief Complaint  Patient presents with  . Rash    HPI Rebecca Shepherd is a 22 y.o. female presenting for evaluation of rash.   Pt states she noticed she was very flushed on her cheeks and chin yesterday.  Today, she woke up with a pruritic rash of bilateral arms, which has spread to her legs and torso.  Rash continues up into her face.  She has taken 2 doses of Benadryl with mild improvement of the itching and no improvement of the rash.  She was not given any antibiotics yesterday.  She was diagnosed with the flu yesterday, sent home with Zofran, Flonase, and Tylenol/ibuprofen.  She has not tried any other medicines.  She denies new exposures, shampoos, conditioners, soaps, detergents,, or foods.  No recent travel.  No tick exposure.  No one else at home with similar rash.  No recent lakes, pollens, pools, or hot tubs. Pt states that outside of the rash, she feels much improved from yesterday.  She denies nausea, vomiting, throat swelling, mouth swelling, tongue swelling, chest pain, shortness of breath, or abdominal pain.  HPI  Past Medical History:  Diagnosis Date  . Overweight     Patient Active Problem List   Diagnosis Date Noted  . Anxiety 09/15/2016  . Elevated LFTs 09/15/2016    Past Surgical History:  Procedure Laterality Date  . APPENDECTOMY    . LAPAROSCOPIC APPENDECTOMY N/A 11/14/2015   Procedure: APPENDECTOMY LAPAROSCOPIC;  Surgeon: Emelia Loron, MD;  Location: [facility] OR;  Service: General;  Laterality: N/A;  . TONSILLECTOMY    . TONSILLECTOMY AND ADENOIDECTOMY      OB History    No data available       Home Medications    Prior to Admission medications   Medication Sig Start Date End Date Taking? Authorizing Provider  albuterol (PROVENTIL HFA;VENTOLIN HFA) 108 (90 Base) MCG/ACT inhaler TAKE 2 PUFFS BY MOUTH  EVERY 6 HOURS AS NEEDED FOR WHEEZE 10/24/17   Tysinger, Kermit Balo, PA-C  cetirizine (ZYRTEC) 10 MG tablet Take 10 mg by mouth daily.     [provider]  Multiple Vitamins-Minerals (WOMENS DAILY FORMULA PO) Take 1 tablet by mouth daily.     [provider]  Norethindrone-Ethinyl Estradiol-Fe Biphas (LO LOESTRIN FE) 1 MG-10 MCG / 10 MCG tablet Take 1 tablet by mouth daily.    [provider]  ondansetron (ZOFRAN) 4 MG tablet Take 1 tablet (4 mg total) by mouth every 8 (eight) hours as needed for nausea or vomiting. 11/30/17   Tereza Gilham, PA-C  promethazine-dextromethorphan (PROMETHAZINE-DM) 6.25-15 MG/5ML syrup Take 5 mLs by mouth 4 (four) times daily as needed for cough. 11/30/17   Madelynn Malson, PA-C  rizatriptan (MAXALT) 10 MG tablet Take 1 tablet (10 mg total) by mouth as needed for migraine. May repeat in 2 hours if needed 10/19/17   Anson Fret, MD  Topiramate ER (QUDEXY XR) 50 MG CS24 sprinkle capsule Take 50 mg by mouth at bedtime. 10/19/17   Anson Fret, MD    Family History Family History  Problem Relation Age of Onset  . Diabetes Other   . Hypertension Other   . Stroke Other   . Migraines Mother     Social History Social History   Tobacco Use  . Smoking status: Never Smoker  . Smokeless tobacco:  Never Used  Substance Use Topics  . Alcohol use: No  . Drug use: No     Allergies   Augmentin [amoxicillin-pot clavulanate]   Review of Systems Review of Systems  Skin: Positive for rash.  All other systems reviewed and are negative.    Physical Exam Updated Vital Signs BP 129/86 (BP Location: Right Arm)   Pulse (!) 111   Temp 98.3 F (36.8 C) (Oral)   Resp 18   LMP 11/16/2017   SpO2 100%   Physical Exam  Constitutional: She is oriented to person, place, and time. She appears well-developed and well-nourished. No distress.  HENT:  Head: Normocephalic and atraumatic.  No oral swelling.  No signs of respiratory distress.   Handling secretions easily.  Eyes: EOM are normal.  Neck: Normal range of motion.  Cardiovascular: Regular rhythm and intact distal pulses.  Mildly tachycardic around 105  Pulmonary/Chest: Effort normal and breath sounds normal. No stridor. No respiratory distress. She has no wheezes.  Speaking full sentences without difficulty breathing.  Clear lung sounds.  Abdominal: Soft. She exhibits no distension. There is no tenderness. There is no guarding.  Musculoskeletal: Normal range of motion.  Neurological: She is alert and oriented to person, place, and time.  Skin: Skin is warm. Rash noted.  Macular rash which appears as if it began discrete and is becoming confluent (especially on the arms). Rash noted on bilateral arms, dorsal hands, torso, neck, and bilateral upper legs.  No involvement on the palms or the feet.  No involvement of the mucous membranes.  Rash is blanching.  No vesicles or pustules.  It is nonpainful.  Not sloughing.  Psychiatric: She has a normal mood and affect.  Nursing note and vitals reviewed.    ED Treatments / Results  Labs (all labs ordered are listed, but only abnormal results are displayed) Labs Reviewed - No data to display  EKG  EKG Interpretation None       Radiology No results found.  Procedures Procedures (including critical care time)  Medications Ordered in ED Medications - No data to display   Initial Impression / Assessment and Plan / ED Course  I have reviewed the triage vital signs and the nursing notes.  Pertinent labs & imaging results that were available during my care of the patient were reviewed by me and considered in my medical decision making (see chart for details).     Patient presenting for evaluation of rash.  Physical exam shows patient who is afebrile and in no respiratory distress.  No sign of mucous membrane involvement or blisters.  Doubt SJS or TENS.  Patient with recent viral illness, ?viral rash.  Discussed case  with attending, Dr. Adela LankFloyd evaluated the patient.  Rash does not appear to be infectious.  Doubt allergic reaction from medicine, as patient without nausea, vomiting, or abdominal symptoms.  Likely due to virus.  Discussed findings with patient.  Discussed treatment with Benadryl for symptom control.  At this time, patient appears safe for discharge.  Return precautions given.  Patient states she understands and agrees to plan.  Final Clinical Impressions(s) / ED Diagnoses   Final diagnoses:  Rash    ED Discharge Orders    None       Alveria ApleyCaccavale, Ziaire Bieser, PA-C 12/01/17 1949    Melene PlanFloyd, Dan, DO 12/01/17 2310

## 2017-12-01 NOTE — ED Triage Notes (Addendum)
Patient reports she was seen yesterday and dx with flu. Since last night patient reports itching rash has spread to face, arms, trunk, and legs. Reports taking tylenol, motrin, and zofran. Denies difficulty breathing and throat swelling. Speaking in full sentences without difficulty. Reports taking 50mg  Benadryl with no relief.

## 2017-12-01 NOTE — Discharge Instructions (Signed)
You may take 2 Benadryl (total of 50 mg) 4 times a day.  Have caution, this may make you tired or groggy.  Make sure you are staying well-hydrated because this can cause dehydration. Return to the emergency room immediately if you develop shortness of breath, difficulty breathing, or lip, tongue, or throat swelling.  Return to the emergency room if there is change or concern about the rash.  Return if the bumps become purplish bruise color, blisters, or any new or concerning symptoms.

## 2017-12-01 NOTE — Telephone Encounter (Signed)
Pt called and states she developed itching yesterday and today worse with rash now pretty much all over body with red raised bumps, no whelps.  NO swelling of tongue, lips, face, no difficulty breathing or throat swelling sensation.   Only new medication given yesterday was Zofran for nausea.  No neck stiffness, no joint pain.  Back does hurt but that started previously.  Temp today is 99.  No rash on palms of hands.  Discussed with Vickie.  Ok to take Benadryl today.   If any symptoms of SOB, wheezing, swelling or difficulty breathing she is to go to ER immediately.  If rash gets worse call office in morning to be seen.

## 2017-12-19 ENCOUNTER — Ambulatory Visit: Payer: Self-pay | Admitting: Neurology

## 2018-01-12 ENCOUNTER — Telehealth: Payer: Self-pay | Admitting: Neurology

## 2018-01-12 MED ORDER — TOPIRAMATE ER 50 MG PO SPRINKLE CAP24: 50.0000 mg | EXTENDED_RELEASE_CAPSULE | Freq: Every day | ORAL | 9 refills | Status: DC

## 2018-01-12 NOTE — Telephone Encounter (Signed)
Pt request rx for Topiramate ER (QUDEXY XR) 50 MG CS24 sprinkle capsule sent [facility] [address]

## 2018-01-12 NOTE — Telephone Encounter (Addendum)
Spoke with Gerald Stabs. He informed RN that certain requirements have to be met for brand. They will pursue brand Qudexy XR at this time.   Spoke with the patient. She reported that the Qudexy XR is working well. She would like for the copay card to be mailed to her home. She is aware that the medication may be harder to get as brand name however copay card should help her to get it for free.   Qudexy XR copay card placed in envelope for mailing to pt home. Copay card works for generic or brand.

## 2018-01-12 NOTE — Telephone Encounter (Signed)
Dr. Lucia GaskinsAhern aware. Qudexy XR 50 mg sent to [facility] [address].

## 2018-01-16 NOTE — Telephone Encounter (Signed)
Called and spoke with pharmacy. They stated that the prescription for Qudexy XR 50 mg is ready for pt pickup with a $0 copay. Pt notified from pharmacy via text.   Also tried to give pt a courtesy call. Voicemail box full at time of call.

## 2018-02-15 IMAGING — CR DG CHEST 2V
2 series · 2 of 2 positions shown · non-contrast
Comparison: None.

CLINICAL DATA: Chest pain, short of breath

EXAM:
CHEST  2 VIEW

[w chest pa]
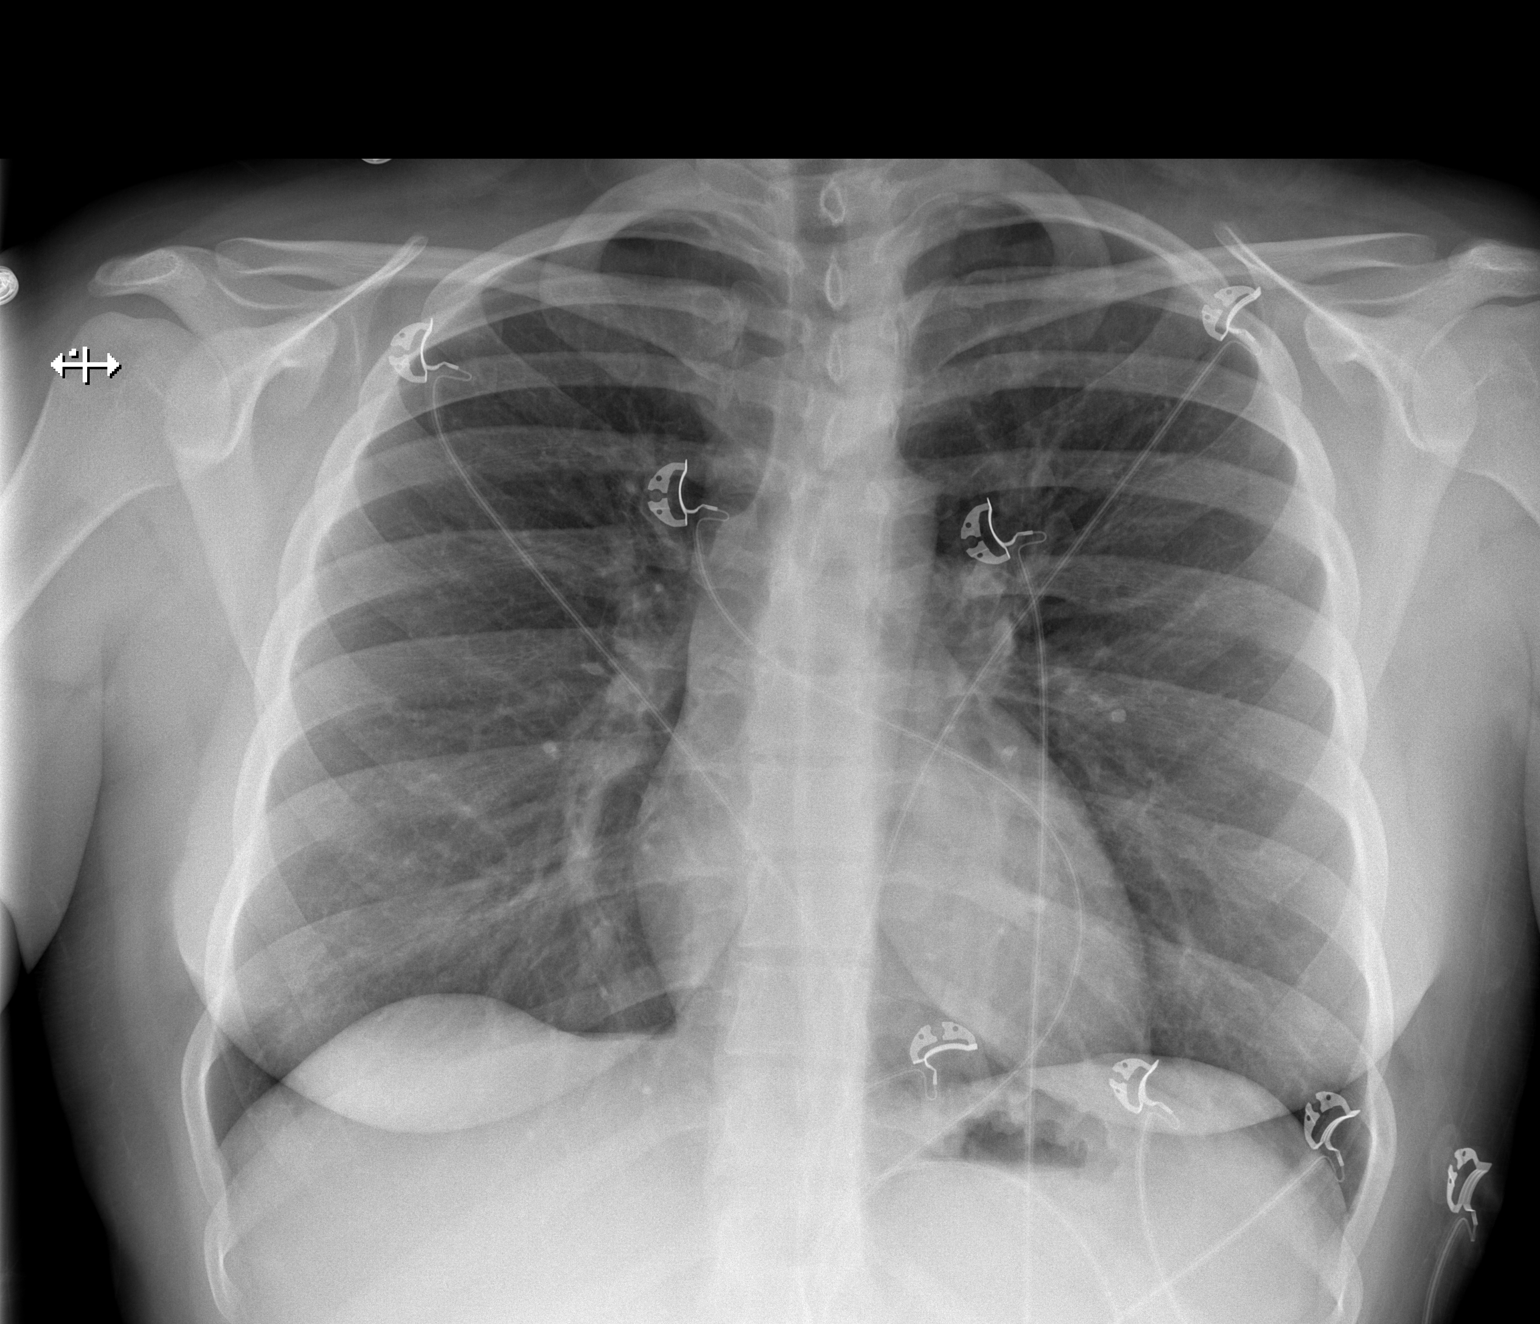

[w chest lat]
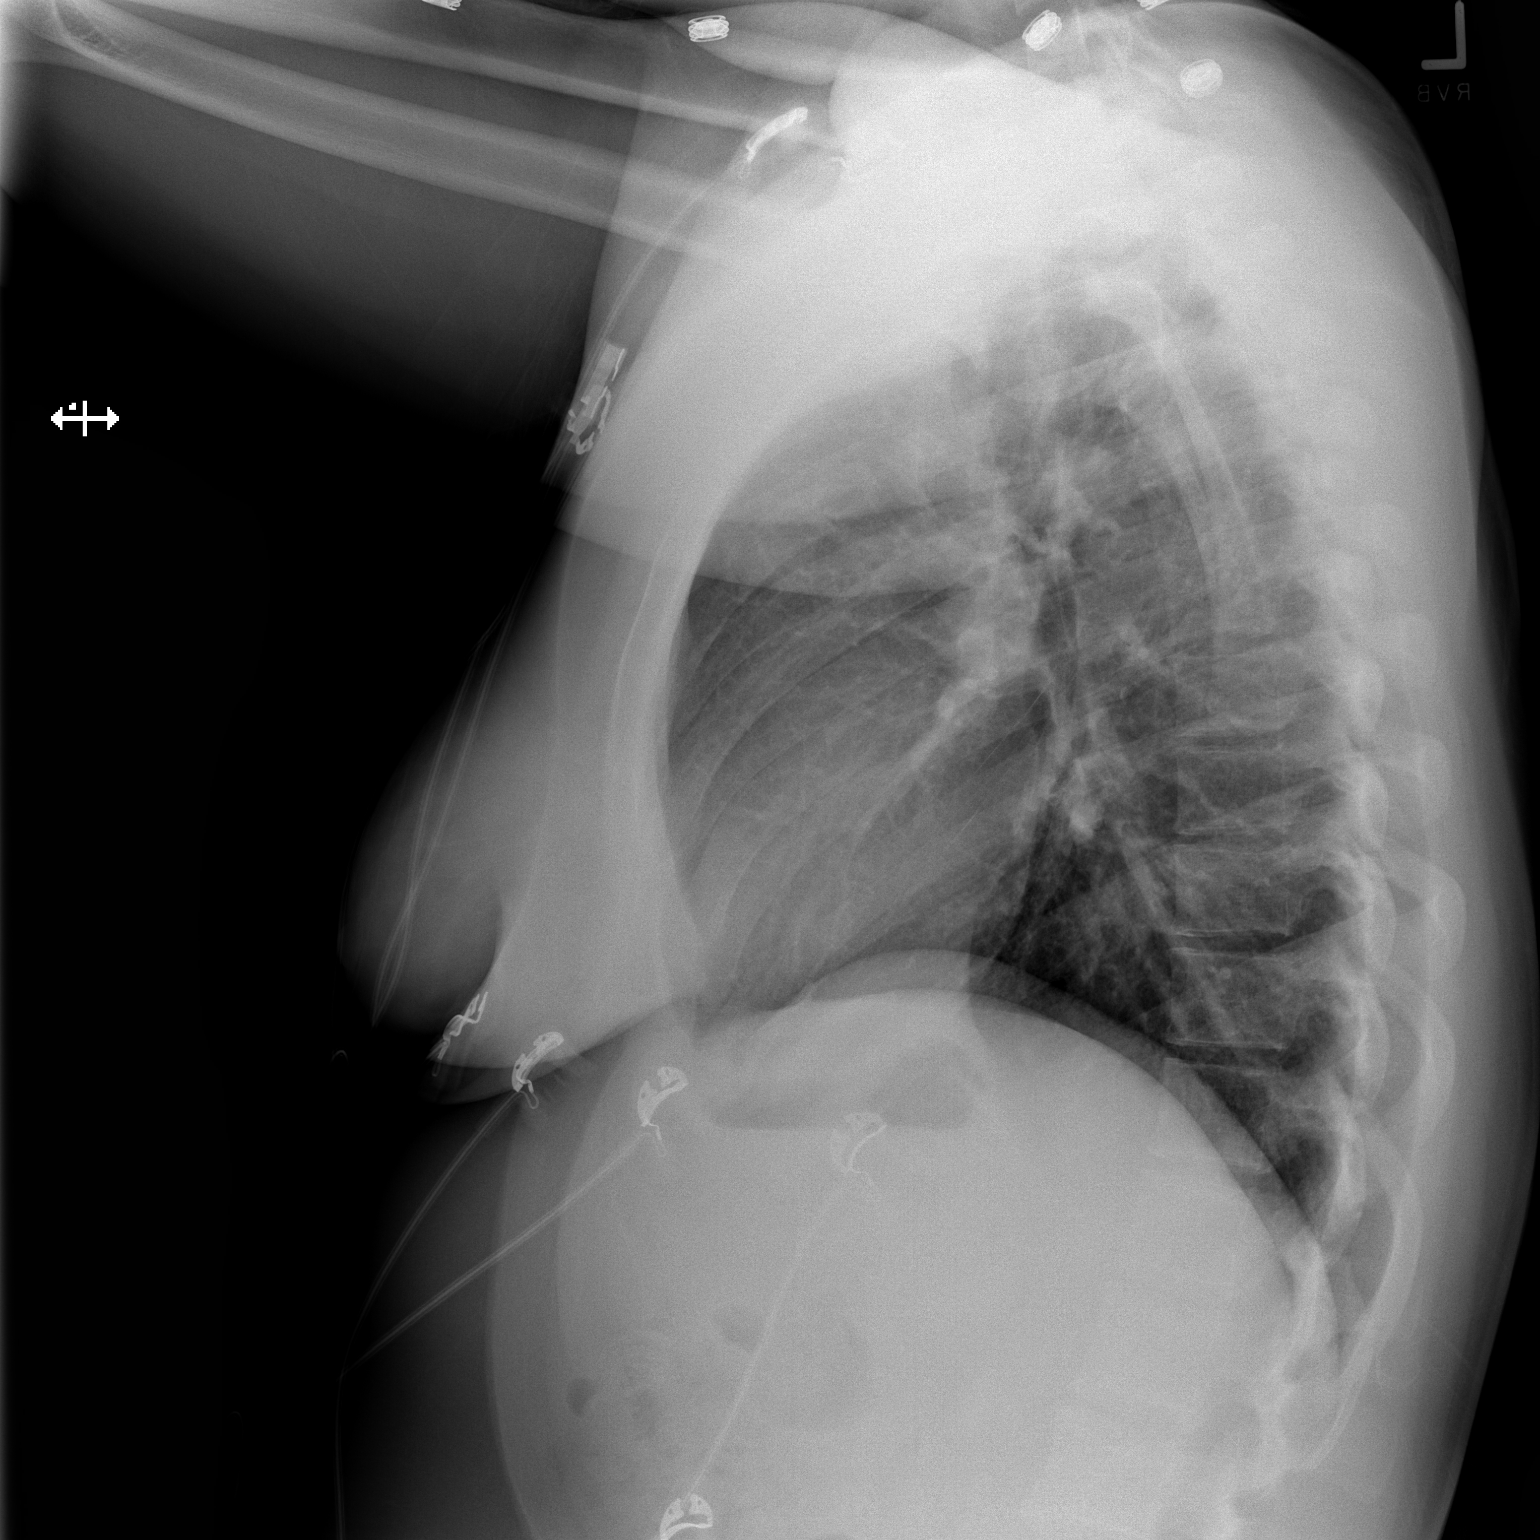

[2 of 2 positions shown; findings below may reference images not displayed]

FINDINGS: Normal mediastinum and cardiac silhouette. Normal pulmonary
vasculature. No evidence of effusion, infiltrate, or pneumothorax.
No acute bony abnormality.
IMPRESSION: No acute cardiopulmonary process.

## 2018-04-19 ENCOUNTER — Other Ambulatory Visit: Payer: Self-pay | Admitting: Neurology

## 2018-07-03 ENCOUNTER — Encounter: Payer: Self-pay | Admitting: Family Medicine

## 2018-07-03 ENCOUNTER — Ambulatory Visit (INDEPENDENT_AMBULATORY_CARE_PROVIDER_SITE_OTHER): Payer: 59 | Admitting: Family Medicine

## 2018-07-03 VITALS — BP 120/70 | HR 71 | Temp 98.4°F | Resp 16 | Wt 177.0 lb

## 2018-07-03 DIAGNOSIS — R05 Cough: Secondary | ICD-10-CM

## 2018-07-03 DIAGNOSIS — R0981 Nasal congestion: Secondary | ICD-10-CM

## 2018-07-03 DIAGNOSIS — R0982 Postnasal drip: Secondary | ICD-10-CM | POA: Diagnosis not present

## 2018-07-03 DIAGNOSIS — R059 Cough, unspecified: Secondary | ICD-10-CM

## 2018-07-03 NOTE — Patient Instructions (Signed)
Continue on the antihistamine and add over-the-counter Mucinex as well as Flonase or Nasacort to help with your symptoms.  You may also want to try saline nasal flush such as a Nettie pot. Take Tylenol or ibuprofen for pain. Make sure you are taking plenty of water.  Call if you are not improving in the next 2 to 3 days

## 2018-07-03 NOTE — Progress Notes (Signed)
Chief Complaint  Patient presents with  . sick    waking up with sneezing, coughing can't breath good through nose. and then night time all her symptoms start over.  x 1.5 week    Subjective:  Rebecca Shepherd is a 22 y.o. female who presents for a 10 day history of greenish-brown nasal drainage, nasal congestion, frontal headache,  post nasal drainage, sore throat in the mornings and mild cough.   Denies fever, chills, ear pain, chest pain, shortness of breath, abdominal pain, N/V/D.   Treatment to date: antihistamines and dayquil.  Denies sick contacts.  No other aggravating or relieving factors.  No other c/o.  ROS as in subjective.   Objective: Vitals:   07/03/18 1442  BP: 120/70  Pulse: 71  Resp: 16  Temp: 98.4 F (36.9 C)  SpO2: 99%    General appearance: Alert, WD/WN, no distress, mildly ill appearing                             Skin: warm, no rash                           Head: mild maxillary sinus tenderness                            Eyes: conjunctiva normal, corneas clear, PERRLA                            Ears: pearly TMs, external ear canals normal                          Nose: septum midline, turbinates swollen, with erythema and clear discharge             Mouth/throat: MMM, tongue normal, mild pharyngeal erythema                           Neck: supple, no adenopathy, no thyromegaly, nontender                          Heart: RRR, normal S1, S2, no murmurs                         Lungs: CTA bilaterally, no wheezes, rales, or rhonchi      Assessment: Post-nasal drainage  Cough  Nasal congestion    Plan: Discussed diagnosis and treatment of URI and that this may be an early sinusitis. Discussed holding off on antibiotic and doing supportive care. She prefers this.  Continue treating allergies.  Suggested symptomatic OTC remedies. Mucinex, Flonase or nasocort. Nettie pot and hydration. Tylenol or Ibuprofen OTC for fever and malaise.  Call/return in 2-3 days  if symptoms aren't resolving.

## 2018-07-23 ENCOUNTER — Other Ambulatory Visit: Payer: Self-pay | Admitting: Neurology

## 2018-07-28 ENCOUNTER — Encounter: Payer: Self-pay | Admitting: Family Medicine

## 2018-07-28 ENCOUNTER — Ambulatory Visit (INDEPENDENT_AMBULATORY_CARE_PROVIDER_SITE_OTHER): Payer: 59 | Admitting: Family Medicine

## 2018-07-28 VITALS — BP 110/70 | HR 73 | Temp 98.5°F | Resp 16 | Wt 174.0 lb

## 2018-07-28 DIAGNOSIS — J0100 Acute maxillary sinusitis, unspecified: Secondary | ICD-10-CM | POA: Diagnosis not present

## 2018-07-28 DIAGNOSIS — R058 Other specified cough: Secondary | ICD-10-CM

## 2018-07-28 DIAGNOSIS — R05 Cough: Secondary | ICD-10-CM

## 2018-07-28 MED ORDER — BENZONATATE 200 MG PO CAPS: 200.0000 mg | ORAL_CAPSULE | Freq: Two times a day (BID) | ORAL | 0 refills | Status: DC | PRN

## 2018-07-28 MED ORDER — AZITHROMYCIN 250 MG PO TABS: ORAL_TABLET | ORAL | 0 refills | Status: DC
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
## 2018-08-02 ENCOUNTER — Telehealth: Payer: Self-pay | Admitting: Family Medicine

## 2018-08-02 MED ORDER — PREDNISONE 10 MG (21) PO TBPK: ORAL_TABLET | ORAL | 0 refills | Status: DC

## 2018-08-02 NOTE — Telephone Encounter (Signed)
Pt states she is not any better on antibiotic, just finished Zpac, wants to know if there is something else she can do or take.

## 2018-08-02 NOTE — Telephone Encounter (Signed)
Please call and find out if her sinuses are still painful or if her symptoms are only chest congestion and cough? Any fever or worsening symptoms? We can add a steroid dose pak to see if this will get her back to baseline. Have her treat for allergies as well with an antihistamine (Claritin, Zyrtec, Xyzal or Allegra). She may also want to try Flonase or Nasocort if having a lot of nasal congestion and drainage.

## 2018-08-02 NOTE — Telephone Encounter (Signed)
Pt states its not her sinueses, its chest congestion and cough is getting worse. She started taking flonase to help with this and takes zyrtec daily. I have sent in predsnisone for pt

## 2018-08-04 ENCOUNTER — Other Ambulatory Visit: Payer: Self-pay | Admitting: Family Medicine

## 2018-08-21 ENCOUNTER — Encounter (HOSPITAL_COMMUNITY): Payer: Self-pay

## 2018-08-21 ENCOUNTER — Ambulatory Visit (HOSPITAL_COMMUNITY)
Admission: EM | Admit: 2018-08-21 | Discharge: 2018-08-21 | Disposition: A | Discharge status: Home / Self Care | Payer: 59 | Attending: Family Medicine | Admitting: Family Medicine

## 2018-08-21 DIAGNOSIS — R519 Headache, unspecified: Secondary | ICD-10-CM

## 2018-08-21 DIAGNOSIS — R51 Headache: Secondary | ICD-10-CM

## 2018-08-21 DIAGNOSIS — M542 Cervicalgia: Secondary | ICD-10-CM

## 2018-08-21 MED ORDER — METHOCARBAMOL 500 MG PO TABS: 500.0000 mg | ORAL_TABLET | Freq: Two times a day (BID) | ORAL | 0 refills | Status: DC
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
## 2018-09-04 ENCOUNTER — Other Ambulatory Visit: Payer: Self-pay | Admitting: Family Medicine

## 2018-10-02 ENCOUNTER — Ambulatory Visit (INDEPENDENT_AMBULATORY_CARE_PROVIDER_SITE_OTHER): Payer: 59 | Admitting: Licensed Clinical Social Worker

## 2018-10-02 DIAGNOSIS — F321 Major depressive disorder, single episode, moderate: Secondary | ICD-10-CM

## 2018-10-05 DIAGNOSIS — M9901 Segmental and somatic dysfunction of cervical region: Secondary | ICD-10-CM | POA: Diagnosis not present

## 2018-10-05 DIAGNOSIS — M5386 Other specified dorsopathies, lumbar region: Secondary | ICD-10-CM | POA: Diagnosis not present

## 2018-10-05 DIAGNOSIS — M9902 Segmental and somatic dysfunction of thoracic region: Secondary | ICD-10-CM | POA: Diagnosis not present

## 2018-10-05 DIAGNOSIS — R51 Headache: Secondary | ICD-10-CM | POA: Diagnosis not present

## 2018-10-10 ENCOUNTER — Ambulatory Visit (INDEPENDENT_AMBULATORY_CARE_PROVIDER_SITE_OTHER): Payer: BLUE CROSS/BLUE SHIELD | Admitting: Licensed Clinical Social Worker

## 2018-10-10 DIAGNOSIS — F321 Major depressive disorder, single episode, moderate: Secondary | ICD-10-CM

## 2018-10-11 ENCOUNTER — Encounter: Payer: Self-pay | Admitting: Family Medicine

## 2018-10-11 ENCOUNTER — Ambulatory Visit (INDEPENDENT_AMBULATORY_CARE_PROVIDER_SITE_OTHER): Payer: BLUE CROSS/BLUE SHIELD | Admitting: Family Medicine

## 2018-10-11 VITALS — BP 124/80 | HR 84 | Wt 172.6 lb

## 2018-10-11 DIAGNOSIS — F32A Depression, unspecified: Secondary | ICD-10-CM | POA: Insufficient documentation

## 2018-10-11 DIAGNOSIS — F329 Major depressive disorder, single episode, unspecified: Secondary | ICD-10-CM | POA: Diagnosis not present

## 2018-10-11 DIAGNOSIS — F419 Anxiety disorder, unspecified: Secondary | ICD-10-CM | POA: Insufficient documentation

## 2018-10-11 MED ORDER — SERTRALINE HCL 50 MG PO TABS: 50.0000 mg | ORAL_TABLET | Freq: Every day | ORAL | 2 refills | Status: DC

## 2018-10-11 NOTE — Patient Instructions (Addendum)
Continue seeing your counselor.   Start on sertraline 25 mg or one-half tablet for the first week. As long as you are doing well, you may increase to the full dose starting week two.   Call or return if you are having any side effects that are worrying you, especially if you have any thoughts of self harm.   Return to see me in 2 weeks.   Sertraline tablets What is this medicine? SERTRALINE (SER tra leen) is used to treat depression. It may also be used to treat obsessive compulsive disorder, panic disorder, post-trauma stress, premenstrual dysphoric disorder (PMDD) or social anxiety. This medicine may be used for other purposes; ask your health care provider or pharmacist if you have questions. COMMON BRAND NAME(S): Zoloft What should I tell my health care provider before I take this medicine? They need to know if you have any of these conditions: -bleeding disorders -bipolar disorder or a family history of bipolar disorder -glaucoma -heart disease -high blood pressure -history of irregular heartbeat -history of low levels of calcium, magnesium, or potassium in the blood -if you often drink alcohol -liver disease -receiving electroconvulsive therapy -seizures -suicidal thoughts, plans, or attempt; a previous suicide attempt by you or a family member -take medicines that treat or prevent blood clots -thyroid disease -an unusual or allergic reaction to sertraline, other medicines, foods, dyes, or preservatives -pregnant or trying to get pregnant -breast-feeding How should I use this medicine? Take this medicine by mouth with a glass of water. Follow the directions on the prescription label. You can take it with or without food. Take your medicine at regular intervals. Do not take your medicine more often than directed. Do not stop taking this medicine suddenly except upon the advice of your doctor. Stopping this medicine too quickly may cause serious side effects or your condition may  worsen. A special MedGuide will be given to you by the pharmacist with each prescription and refill. Be sure to read this information carefully each time. Talk to your pediatrician regarding the use of this medicine in children. While this drug may be prescribed for children as young as 7 years for selected conditions, precautions do apply. Overdosage: If you think you have taken too much of this medicine contact a poison control center or emergency room at once. NOTE: This medicine is only for you. Do not share this medicine with others. What if I miss a dose? If you miss a dose, take it as soon as you can. If it is almost time for your next dose, take only that dose. Do not take double or extra doses. What may interact with this medicine? Do not take this medicine with any of the following medications: -cisapride -dofetilide -dronedarone -linezolid -MAOIs like Carbex, Eldepryl, Marplan, Nardil, and Parnate -methylene blue (injected into a vein) -pimozide -thioridazine This medicine may also interact with the following medications: -alcohol -amphetamines -aspirin and aspirin-like medicines -certain medicines for depression, anxiety, or psychotic disturbances -certain medicines for fungal infections like ketoconazole, fluconazole, posaconazole, and itraconazole -certain medicines for irregular heart beat like flecainide, quinidine, propafenone -certain medicines for migraine headaches like almotriptan, eletriptan, frovatriptan, naratriptan, rizatriptan, sumatriptan, zolmitriptan -certain medicines for sleep -certain medicines for seizures like carbamazepine, valproic acid, phenytoin -certain medicines that treat or prevent blood clots like warfarin, enoxaparin, dalteparin -cimetidine -digoxin -diuretics -fentanyl -isoniazid -lithium -NSAIDs, medicines for pain and inflammation, like ibuprofen or naproxen -other medicines that prolong the QT interval (cause an abnormal heart  rhythm) -rasagiline -safinamide -supplements  like St. John's wort, kava kava, valerian -tolbutamide -tramadol -tryptophan This list may not describe all possible interactions. Give your health care provider a list of all the medicines, herbs, non-prescription drugs, or dietary supplements you use. Also tell them if you smoke, drink alcohol, or use illegal drugs. Some items may interact with your medicine. What should I watch for while using this medicine? Tell your doctor if your symptoms do not get better or if they get worse. Visit your doctor or health care professional for regular checks on your progress. Because it may take several weeks to see the full effects of this medicine, it is important to continue your treatment as prescribed by your doctor. Patients and their families should watch out for new or worsening thoughts of suicide or depression. Also watch out for sudden changes in feelings such as feeling anxious, agitated, panicky, irritable, hostile, aggressive, impulsive, severely restless, overly excited and hyperactive, or not being able to sleep. If this happens, especially at the beginning of treatment or after a change in dose, call your health care professional. Bonita QuinYou may get drowsy or dizzy. Do not drive, use machinery, or do anything that needs mental alertness until you know how this medicine affects you. Do not stand or sit up quickly, especially if you are an older patient. This reduces the risk of dizzy or fainting spells. Alcohol may interfere with the effect of this medicine. Avoid alcoholic drinks. Your mouth may get dry. Chewing sugarless gum or sucking hard candy, and drinking plenty of water may help. Contact your doctor if the problem does not go away or is severe. What side effects may I notice from receiving this medicine? Side effects that you should report to your doctor or health care professional as soon as possible: -allergic reactions like skin rash, itching or  hives, swelling of the face, lips, or tongue -anxious -black, tarry stools -changes in vision -confusion -elevated mood, decreased need for sleep, racing thoughts, impulsive behavior -eye pain -fast, irregular heartbeat -feeling faint or lightheaded, falls -feeling agitated, angry, or irritable -hallucination, loss of contact with reality -loss of balance or coordination -loss of memory -painful or prolonged erections -restlessness, pacing, inability to keep still -seizures -stiff muscles -suicidal thoughts or other mood changes -trouble sleeping -unusual bleeding or bruising -unusually weak or tired -vomiting Side effects that usually do not require medical attention (report to your doctor or health care professional if they continue or are bothersome): -change in appetite or weight -change in sex drive or performance -diarrhea -increased sweating -indigestion, nausea -tremors This list may not describe all possible side effects. Call your doctor for medical advice about side effects. You may report side effects to FDA at 1-800-FDA-1088. Where should I keep my medicine? Keep out of the reach of children. Store at room temperature between 15 and 30 degrees C (59 and 86 degrees F). Throw away any unused medicine after the expiration date. NOTE: This sheet is a summary. It may not cover all possible information. If you have questions about this medicine, talk to your doctor, pharmacist, or health care provider.  2019 Elsevier/Gold Standard (2016-09-24 14:17:49)

## 2018-10-11 NOTE — Progress Notes (Signed)
   Subjective:    Patient ID: Rebecca Shepherd, female    DOB: 1996/08/16, 23 y.o.   MRN: [id number]  HPI Chief Complaint  Patient presents with  . discuss med    therapist told her to come in to discuss getting on a med for depression   She is here to discuss starting on medication for depression. States her therapist, Berniece Andreas, recommends medication in addition to counseling.  States she always feels more depressed in the winter but this winter her depression is much worse.   States she broke up with her boyfriend of 5 years.  Also states she has lost some friendships recently.   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  Lives with a female roommate for now.   Working out more but this is not helping. Has never been on medication for anxiety or depression.   Recalls having issues with anxiety and depression as far back as childhood.   Family history of depression in father.   LMP: 09/19/2018 Contraception: OCPs In a new relationship.   Denies smoking, drug use. Once weekly alcohol use.  Denies history of addiction.   Denies fever, chills, dizziness, chest pain, palpitations, shortness of breath, abdominal pain, N/V/D.    Depression screen [facility] 2/9 10/11/2018 08/18/2016  Decreased Interest 2 0  Down, Depressed, Hopeless 2 1  PHQ - 2 Score 4 1  Altered sleeping 3 1  Tired, decreased energy 3 1  Change in appetite 1 0  Feeling bad or failure about yourself  1 0  Trouble concentrating 3 1  Moving slowly or fidgety/restless 3 1  Suicidal thoughts 0 0  PHQ-9 Score 18 5  Difficult doing work/chores Somewhat difficult -      Review of Systems Pertinent positives and negatives in the history of present illness.     Objective:   Physical Exam BP 124/80   Pulse 84   Wt 172 lb 9.6 oz (78.3 kg)   BMI 26.24 kg/m   Alert and oriented and in no acute distress. She is tearful and visibly depressed but hopeful. Denies SI.       Assessment & Plan:  Depression,  unspecified depression type - Plan: sertraline (ZOLOFT) 50 MG tablet  She does not appear to be in any danger and in no acute distress. Denies thoughts of self harm.  Continue seeing counselor.   Start on sertraline 25 mg or one-half tablet for the first week. As long as she is doing well, may increase to the full dose starting week two. Discussed possible side effects.   Call or return if having any side effects that are worrisome, especially if she has any thoughts of self harm.   Return to see me in 2 weeks.

## 2018-10-14 ENCOUNTER — Other Ambulatory Visit: Payer: Self-pay | Admitting: Family Medicine

## 2018-10-16 ENCOUNTER — Other Ambulatory Visit: Payer: Self-pay | Admitting: Family Medicine

## 2018-10-16 NOTE — Telephone Encounter (Signed)
Is this refill appropriate?  

## 2018-10-18 ENCOUNTER — Other Ambulatory Visit: Payer: Self-pay | Admitting: Neurology

## 2018-10-18 ENCOUNTER — Telehealth: Payer: Self-pay | Admitting: *Deleted

## 2018-10-18 NOTE — Telephone Encounter (Signed)
Note opened in error.

## 2018-10-19 ENCOUNTER — Telehealth: Payer: Self-pay | Admitting: *Deleted

## 2018-10-19 ENCOUNTER — Encounter: Payer: Self-pay | Admitting: *Deleted

## 2018-10-19 NOTE — Telephone Encounter (Addendum)
Received PA request from [facility] for Qudexy XR 50 mg #90. Completed PA under KEY: [id number]. Received immediate approval by [employer] Effective from 10/19/2018 through 10/17/2021.   Sent pt a [employer] geologist. Called [facility] and notified Tacey Ruiz of approval dates.

## 2018-10-23 ENCOUNTER — Ambulatory Visit (INDEPENDENT_AMBULATORY_CARE_PROVIDER_SITE_OTHER): Payer: BLUE CROSS/BLUE SHIELD | Admitting: Licensed Clinical Social Worker

## 2018-10-23 DIAGNOSIS — F321 Major depressive disorder, single episode, moderate: Secondary | ICD-10-CM

## 2018-10-26 ENCOUNTER — Ambulatory Visit: Payer: BLUE CROSS/BLUE SHIELD | Admitting: Family Medicine

## 2018-10-30 ENCOUNTER — Ambulatory Visit (INDEPENDENT_AMBULATORY_CARE_PROVIDER_SITE_OTHER): Payer: BLUE CROSS/BLUE SHIELD | Admitting: Family Medicine

## 2018-10-30 ENCOUNTER — Encounter: Payer: Self-pay | Admitting: Family Medicine

## 2018-10-30 VITALS — BP 120/80 | HR 93 | Wt 177.8 lb

## 2018-10-30 DIAGNOSIS — F329 Major depressive disorder, single episode, unspecified: Secondary | ICD-10-CM

## 2018-10-30 DIAGNOSIS — F32A Depression, unspecified: Secondary | ICD-10-CM

## 2018-10-30 DIAGNOSIS — R233 Spontaneous ecchymoses: Secondary | ICD-10-CM

## 2018-10-30 DIAGNOSIS — N926 Irregular menstruation, unspecified: Secondary | ICD-10-CM | POA: Diagnosis not present

## 2018-10-30 DIAGNOSIS — R238 Other skin changes: Secondary | ICD-10-CM

## 2018-10-30 LAB — CBC WITH DIFFERENTIAL/PLATELET
Basophils Absolute: 0.1 10*3/uL (ref 0.0–0.2)
Basos: 1 %
EOS (ABSOLUTE): 0.3 10*3/uL (ref 0.0–0.4)
EOS: 3 %
Hematocrit: 40.8 % (ref 34.0–46.6)
Hemoglobin: 13.9 g/dL (ref 11.1–15.9)
Immature Grans (Abs): 0 10*3/uL (ref 0.0–0.1)
Immature Granulocytes: 0 %
Lymphocytes Absolute: 2.6 10*3/uL (ref 0.7–3.1)
Lymphs: 35 %
MCH: 31.1 pg (ref 26.6–33.0)
MCHC: 34.1 g/dL (ref 31.5–35.7)
MCV: 91 fL (ref 79–97)
Monocytes Absolute: 0.5 10*3/uL (ref 0.1–0.9)
Monocytes: 6 %
Neutrophils Absolute: 4 10*3/uL (ref 1.4–7.0)
Neutrophils: 55 %
Platelets: 255 10*3/uL (ref 150–450)
RBC: 4.47 x10E6/uL (ref 3.77–5.28)
RDW: 12.3 % (ref 11.7–15.4)
WBC: 7.5 10*3/uL (ref 3.4–10.8)

## 2018-10-30 LAB — POCT URINE PREGNANCY: Preg Test, Ur: NEGATIVE

## 2018-10-30 NOTE — Patient Instructions (Signed)
Let me know if you continue having abnormal bruising. We will call you with your lab results.   Follow up with me in 3 months or sooner if you need to.

## 2018-10-30 NOTE — Progress Notes (Signed)
   Subjective:    Patient ID: Rebecca Shepherd, female    DOB: 14-Jan-1996, 23 y.o.   MRN: [id number]  HPI Chief Complaint  Patient presents with  . follow-up    follow-up on depression, missed period but no chance of pregnacy so wanting to know if med can throw it off   She is here to follow-up on depression and starting new medication sertraline. States she likes how she is feeling on the medication. Sleeping better and mood is more stable.   Complains of intermittent unexplained bruising to her legs.  History of this as well when she had anemia.  States that he will very quickly however and she does not have any today.  Denies any sign of bleeding.  Denies epistaxis, gum bleeding.  States she skipped her period that she should have started it 10 days ago. Is not sexually active.  She is taking daily OCPs.  No other complaints or concerns. Denies fever, chills, chest pain, palpitations, shortness of breath, abdominal pain, nausea, vomiting, diarrhea.   Review of Systems Pertinent positives and negatives in the history of present illness.     Objective:   Physical Exam BP 120/80   Pulse 93   Wt 177 lb 12.8 oz (80.6 kg)   BMI 27.03 kg/m   Alert and oriented and in no acute distress.  Skin is warm and dry, no rash or pallor or bruising.      Assessment & Plan:  Depression, unspecified depression type  Missed period - Plan: POCT urine pregnancy  Abnormal bruising - Plan: CBC with Differential/Platelet  UPT is negative. She reports improvement in mood and sleep after only a couple weeks of the sertraline.  She would like to continue on this medication. Unclear etiology for bruising reported by patient.  Unable to visualize any bruising today.  Check CBC. She may follow-up in 3 months unless she has any concerns or new symptoms.

## 2018-11-04 ENCOUNTER — Other Ambulatory Visit: Payer: Self-pay | Admitting: Family Medicine

## 2018-11-04 DIAGNOSIS — F329 Major depressive disorder, single episode, unspecified: Secondary | ICD-10-CM

## 2018-11-04 DIAGNOSIS — F32A Depression, unspecified: Secondary | ICD-10-CM

## 2018-11-21 ENCOUNTER — Other Ambulatory Visit: Payer: Self-pay | Admitting: Family Medicine

## 2018-11-21 NOTE — Telephone Encounter (Signed)
Is this ok to refill?  

## 2018-11-22 ENCOUNTER — Ambulatory Visit: Payer: Self-pay | Admitting: Licensed Clinical Social Worker

## 2018-12-18 ENCOUNTER — Telehealth: Payer: Self-pay

## 2018-12-18 NOTE — Telephone Encounter (Signed)
[facility] sent a fax inquiring about patient asthma care because they noticed patient has several rescue inhaler refills but no controller for asthma. They are reaching out on behalf of the patient to see if it is appropriate to start patient is an asthma controller. Please advise.

## 2018-12-18 NOTE — Telephone Encounter (Signed)
Tried to call pt but got disconnected. Will call tomorrow

## 2018-12-18 NOTE — Telephone Encounter (Signed)
Please call and see how often she is needing her albuterol inhaler.

## 2018-12-19 NOTE — Telephone Encounter (Signed)
Pt states her allergies have been acting up and she is congested and stopped up more at night when she lays down so she has to use it before bed daily. Please advise

## 2018-12-19 NOTE — Telephone Encounter (Signed)
She needs to be on allergy medication to prevent needing the albuterol inhaler daily. What is she taking already for allergies? This is my recommendation to treat her allergies- She needs to be on a nasal spray such as Flonase or Nasocort, an oral non drowsy antihistamine such as Xyzal, Claritin, Allegra or Zyrtec and we may need to add Singulair to her allergy medication regimen. Singulair is a prescription only medication to take in the evening. She can come in to be seen or we can try her on this triple medication regimen to see if her allergies improve. We need to reduce the amount of times per week she is needing her rescue inhaler.

## 2018-12-20 MED ORDER — MONTELUKAST SODIUM 10 MG PO TABS: 10.0000 mg | ORAL_TABLET | Freq: Every day | ORAL | 2 refills | Status: DC

## 2018-12-20 NOTE — Telephone Encounter (Signed)
Left message for pt to call me back 

## 2018-12-20 NOTE — Telephone Encounter (Signed)
Claritin @ night and zyrtec in am. Will add on singular at night to see if this helps. If not she will need to be seen for PFT.

## 2019-01-22 DIAGNOSIS — Z130 Encounter for screening for diseases of the blood and blood-forming organs and certain disorders involving the immune mechanism: Secondary | ICD-10-CM | POA: Diagnosis not present

## 2019-01-22 DIAGNOSIS — Z113 Encounter for screening for infections with a predominantly sexual mode of transmission: Secondary | ICD-10-CM | POA: Diagnosis not present

## 2019-01-22 DIAGNOSIS — Z01419 Encounter for gynecological examination (general) (routine) without abnormal findings: Secondary | ICD-10-CM | POA: Diagnosis not present

## 2019-01-22 DIAGNOSIS — Z124 Encounter for screening for malignant neoplasm of cervix: Secondary | ICD-10-CM | POA: Diagnosis not present

## 2019-01-22 DIAGNOSIS — Z1389 Encounter for screening for other disorder: Secondary | ICD-10-CM | POA: Diagnosis not present

## 2019-01-24 LAB — HM PAP SMEAR: HM Pap smear: NEGATIVE

## 2019-01-29 ENCOUNTER — Ambulatory Visit: Payer: BLUE CROSS/BLUE SHIELD | Admitting: Family Medicine

## 2019-01-29 ENCOUNTER — Encounter: Payer: Self-pay | Admitting: Family Medicine

## 2019-01-29 ENCOUNTER — Other Ambulatory Visit: Payer: Self-pay

## 2019-01-29 ENCOUNTER — Ambulatory Visit (INDEPENDENT_AMBULATORY_CARE_PROVIDER_SITE_OTHER): Payer: BLUE CROSS/BLUE SHIELD | Admitting: Family Medicine

## 2019-01-29 VITALS — Wt 185.0 lb

## 2019-01-29 DIAGNOSIS — F329 Major depressive disorder, single episode, unspecified: Secondary | ICD-10-CM | POA: Diagnosis not present

## 2019-01-29 DIAGNOSIS — F32A Depression, unspecified: Secondary | ICD-10-CM

## 2019-01-29 MED ORDER — SERTRALINE HCL 50 MG PO TABS: 50.0000 mg | ORAL_TABLET | Freq: Every day | ORAL | 1 refills | Status: DC

## 2019-01-29 NOTE — Progress Notes (Signed)
   Subjective:   Documentation for virtual audio and video telecommunications through Zoom encounter:  The patient was located at home. 2 patient identifiers used.  The provider was located in the office. The patient did consent to this visit and is aware of possible charges through their insurance for this visit.  The other persons participating in this telemedicine service were none.     Patient ID: Rebecca Shepherd, female    DOB: April 04, 1996, 23 y.o.   MRN: [id number]  HPI Chief Complaint  Patient presents with  . foolow-up on depression    follow-up on depression   This is a virtual 3 month follow up on depression. Started her on Sertraline in January. States she is doing very well.  States her mood is stable. Sleeping well.   Has not seen her counselor and states she does not feel like she needs to now.   Has an OB/GYN and had her annual exam in the past few months.  Dr. Tawanna Cooler Miesinger. States she had a pap smear.  Ok for me to request notes and results. Declines having a CPE here this year.   Denies fever, chills, dizziness, chest pain, palpitations, shortness of breath, abdominal pain, N/V/D, urinary symptoms.   On OCPs.    Depression screen [facility] 2/9 01/29/2019 10/11/2018 08/18/2016  Decreased Interest 0 2 0  Down, Depressed, Hopeless 0 2 1  PHQ - 2 Score 0 4 1  Altered sleeping - 3 1  Tired, decreased energy - 3 1  Change in appetite - 1 0  Feeling bad or failure about yourself  - 1 0  Trouble concentrating - 3 1  Moving slowly or fidgety/restless - 3 1  Suicidal thoughts - 0 0  PHQ-9 Score - 18 5  Difficult doing work/chores - Somewhat difficult -    Reviewed allergies, medications, past medical, surgical, family, and social history.    Review of Systems Pertinent positives and negatives in the history of present illness.     Objective:   Physical Exam Wt 185 lb (83.9 kg)   LMP 01/01/2019   BMI 28.13 kg/m   Alert and oriented in no acute distress.   Normal mood, speech and thought process.      Assessment & Plan:  Depression, unspecified depression type - Plan: sertraline (ZOLOFT) 50 MG tablet  She is doing well. Continue on Sertraline. See her counselor as needed. I will request recent notes and results from her OB/GYN.  She will follow-up in 6 months or sooner if needed.  She declines a CPE here in our office for this year.  Time spent on call was 15 minutes and in review of previous records 2 minutes total.  This virtual service is not related to other E/M service within previous 7 days.

## 2019-02-08 ENCOUNTER — Telehealth: Payer: Self-pay | Admitting: *Deleted

## 2019-02-08 ENCOUNTER — Other Ambulatory Visit: Payer: Self-pay | Admitting: Neurology

## 2019-02-08 NOTE — Telephone Encounter (Signed)
We received another refill request for Qudexy. Pt last seen Jan 2019. I called pt & LVM (ok per DPR) advising her that in order to continue providing refills she will need an appointment or if she is doing well and wasn't planning to f/u with neurology then she can have PCP provide refills.Let her know what with the COVID19 pandemic, we are doing mostly telemedicine. I asked for a call back letting us know what she would like to do.  Left office number in message.  Advised a 30 day supply would be sent to her pharmacy however further refills require an appt.

## 2019-03-27 DIAGNOSIS — Z20828 Contact with and (suspected) exposure to other viral communicable diseases: Secondary | ICD-10-CM | POA: Diagnosis not present

## 2019-03-28 ENCOUNTER — Other Ambulatory Visit: Payer: Self-pay | Admitting: Family Medicine

## 2019-04-11 ENCOUNTER — Telehealth: Payer: Self-pay | Admitting: Family Medicine

## 2019-04-11 ENCOUNTER — Encounter: Payer: Self-pay | Admitting: Internal Medicine

## 2019-04-11 NOTE — Telephone Encounter (Signed)
Records received from [facility] °

## 2019-06-07 DIAGNOSIS — Z20828 Contact with and (suspected) exposure to other viral communicable diseases: Secondary | ICD-10-CM | POA: Diagnosis not present

## 2019-06-20 ENCOUNTER — Other Ambulatory Visit: Payer: Self-pay | Admitting: Family Medicine

## 2019-07-18 ENCOUNTER — Ambulatory Visit (INDEPENDENT_AMBULATORY_CARE_PROVIDER_SITE_OTHER): Payer: BC Managed Care – PPO | Admitting: Family Medicine

## 2019-07-18 ENCOUNTER — Encounter: Payer: Self-pay | Admitting: Family Medicine

## 2019-07-18 ENCOUNTER — Other Ambulatory Visit: Payer: Self-pay

## 2019-07-18 VITALS — Wt 185.0 lb

## 2019-07-18 DIAGNOSIS — N3000 Acute cystitis without hematuria: Secondary | ICD-10-CM

## 2019-07-18 DIAGNOSIS — R3915 Urgency of urination: Secondary | ICD-10-CM | POA: Diagnosis not present

## 2019-07-18 DIAGNOSIS — R300 Dysuria: Secondary | ICD-10-CM | POA: Diagnosis not present

## 2019-07-18 MED ORDER — NITROFURANTOIN MONOHYD MACRO 100 MG PO CAPS: 100.0000 mg | ORAL_CAPSULE | Freq: Two times a day (BID) | ORAL | 0 refills | Status: DC

## 2019-07-18 NOTE — Progress Notes (Signed)
   Subjective:  Documentation for virtual audio and video telecommunications through [place] encounter:  The patient was located at home. 2 patient identifiers used.  The provider was located in the office. The patient did consent to this visit and is aware of possible charges through their insurance for this visit.  The other persons participating in this telemedicine service were none.    Patient ID: Rebecca Shepherd, female    DOB: 07/25/1996, 23 y.o.   MRN: [id number]  HPI Chief Complaint  Patient presents with  . UTI    possible UTI, back pain, cramping (just got off period) burning with urination, urine darker than normal   Complains of a a 3 day history of dysuria, urinary urgency, frequency, dark urine and low back pain.  He also has some mild lower abdominal cramping which is mild now improving.  States she thinks this is related to her period which she just finished.  States this feels like a UTI. She did not come in today since she has moved to [facility].  Last UTI one year ago. No recurrent UTIs.   Denies fever, chills, dizziness, chest pain, palpitations, shortness of breath, abdominal pain, N/V/D, urinary symptoms, LE edema.   LMP: last week Has been on birth control pills.   Reviewed allergies, medications, past medical, surgical, family, and social history.    Review of Systems Pertinent positives and negatives in the history of present illness.     Objective:   Physical Exam  Wt 185 lb (83.9 kg)   BMI 28.13 kg/m   Alert and oriented and in no acute distress      Assessment & Plan:  Acute cystitis without hematuria  Dysuria  Urinary urgency  Discussed limitations of virtual visit.  She no longer lives in [place].  I will treat her empirically for UTI.  Discussed course of illness and red flag symptoms. Macrobid sent to her pharmacy.  Encouraged her to increase water intake.  She may take Azo for 1 to 2 days and then stop.  She  will let me know if she is worsening or not back to baseline after completing the antibiotic.  Time spent on call was 16 minutes and in review of previous records 2 minutes total.  This virtual service is not related to other E/M service within previous 7 days.

## 2019-08-06 ENCOUNTER — Other Ambulatory Visit: Payer: Self-pay | Admitting: Family Medicine

## 2019-08-06 DIAGNOSIS — F329 Major depressive disorder, single episode, unspecified: Secondary | ICD-10-CM

## 2019-08-06 DIAGNOSIS — F32A Depression, unspecified: Secondary | ICD-10-CM

## 2019-08-06 NOTE — Telephone Encounter (Signed)
Is this ok to refill?  

## 2019-08-06 NOTE — Telephone Encounter (Signed)
Tried to call pt but vm is full 

## 2019-08-06 NOTE — Telephone Encounter (Signed)
Needs a visit, virtual is ok. I recall that she moved since our last visit in the spring of 2020. If so, she may have established with a new PCP.

## 2019-08-07 NOTE — Telephone Encounter (Signed)
Tried to call pt but vm is full 

## 2019-08-13 ENCOUNTER — Other Ambulatory Visit: Payer: Self-pay | Admitting: Family Medicine

## 2019-08-13 DIAGNOSIS — F329 Major depressive disorder, single episode, unspecified: Secondary | ICD-10-CM

## 2019-08-13 DIAGNOSIS — F32A Depression, unspecified: Secondary | ICD-10-CM

## 2019-08-14 NOTE — Telephone Encounter (Signed)
Pt has scheduled a virtual but she is completely out of her med so I will refill this

## 2019-08-15 ENCOUNTER — Other Ambulatory Visit: Payer: Self-pay

## 2019-08-15 ENCOUNTER — Encounter: Payer: Self-pay | Admitting: Family Medicine

## 2019-08-15 ENCOUNTER — Ambulatory Visit: Payer: BC Managed Care – PPO | Admitting: Family Medicine

## 2019-08-15 VITALS — Temp 98.5°F | Wt 190.0 lb

## 2019-08-15 DIAGNOSIS — F410 Panic disorder [episodic paroxysmal anxiety] without agoraphobia: Secondary | ICD-10-CM | POA: Diagnosis not present

## 2019-08-15 DIAGNOSIS — F419 Anxiety disorder, unspecified: Secondary | ICD-10-CM

## 2019-08-15 NOTE — Progress Notes (Signed)
Subjective:  Documentation for virtual audio and video telecommunications through [place] encounter:  The patient was located in her car. 2 patient identifiers used.  The provider was located in the office. The patient did consent to this visit and is aware of possible charges through their insurance for this visit.  The other persons participating in this telemedicine service were none.    Patient ID: Rebecca Shepherd, female    DOB: [birth year], 23 y.o.   MRN: [id number]  HPI Chief Complaint  Patient presents with  . med check    med check. out of meds   This is a medication management check for refill of sertaline.  She has moved and no longer lives in [place].  She has not yet found a new PCP.  Reports sertraline has improved her mood significantly and that she can tell a big difference.  States due to recent issues with stress concerning the election and other things in society, she is having panic attacks.  States she has a history of panic attacks and is aware of how to calm herself down.  She has seen a therapist in the past but is not currently seen one.  Denies any thoughts of self-harm.  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   No other concerns.  States she is stressed because she is late for work.  States physically she feels fine.  Denies fever, chills, headache, dizziness, chest pain, palpitations, shortness of breath, abdominal pain, nausea, vomiting, diarrhea.   Depression screen [facility] 2/9 08/15/2019 01/29/2019 10/11/2018 08/18/2016  Decreased Interest 1 0 2 0  Down, Depressed, Hopeless 0 0 2 1  PHQ - 2 Score 1 0 4 1  Altered sleeping - - 3 1  Tired, decreased energy - - 3 1  Change in appetite - - 1 0  Feeling bad or failure about yourself  - - 1 0  Trouble concentrating - - 3 1  Moving slowly or fidgety/restless - - 3 1  Suicidal thoughts - - 0 0  PHQ-9 Score - - 18 5  Difficult doing work/chores - - Somewhat difficult -     Review of Systems  Pertinent positives and negatives in the history of present illness.     Objective:   Physical Exam Temp 98.5 F (36.9 C)   Wt 190 lb (86.2 kg)   BMI 28.89 kg/m   Alert and oriented and in no acute distress.  Respirations unlabored and normal.  Her mood does appear anxious.  Denies suicidal ideation.      Assessment & Plan:  Anxiety  Panic attacks  This is a virtual visit to discuss anxiety and sertraline refill.  She no longer lives in [place] and has not found a PCP at her new location. Apparently she has been off her medication for 2 days due to not calling us back so that we could refill her medication.  She did not tell me this however my CMA did after I completed the visit. She reports being physically at baseline however she has noticed increased frequency of panic attacks. Does not appear to be in any danger and denies thoughts of self-harm. I recommend that she find a therapist at her new location.  She will continue sertraline for now. Advised that I could refill this medication for her for a couple of months to give her time to find a PCP.  Advised that if she wants to continue being a patient here, she will need  to come in for an in office visit in 3 months. I attempted to call the patient back after finding out she had been off the sertraline for 2 days but she di d not answer and her voice mailbox was full.  Time spent on call was 12 minutes and in review of previous records 15 minutes total.  This virtual service is not related to other E/M service within previous 7 days.

## 2019-09-05 ENCOUNTER — Other Ambulatory Visit: Payer: Self-pay | Admitting: Family Medicine

## 2019-09-05 DIAGNOSIS — F329 Major depressive disorder, single episode, unspecified: Secondary | ICD-10-CM

## 2019-09-05 DIAGNOSIS — F32A Depression, unspecified: Secondary | ICD-10-CM

## 2019-09-05 NOTE — Telephone Encounter (Signed)
Needs virtual visit to follow up since starting back on medication 4 weeks ago. Then will be ok to give 90 days.

## 2019-09-05 NOTE — Telephone Encounter (Signed)
This was in refill request asking for a 90 day refill and DX code.

## 2019-09-05 NOTE — Telephone Encounter (Signed)
Mailbox was full, will try again tomorrow if she doesn't call back.

## 2019-09-06 NOTE — Telephone Encounter (Signed)
Per vickie ok to refill for #90

## 2019-09-20 ENCOUNTER — Other Ambulatory Visit: Payer: Self-pay | Admitting: Family Medicine

## 2019-12-05 ENCOUNTER — Other Ambulatory Visit: Payer: Self-pay | Admitting: Family Medicine

## 2019-12-05 DIAGNOSIS — F32A Depression, unspecified: Secondary | ICD-10-CM

## 2019-12-05 DIAGNOSIS — F329 Major depressive disorder, single episode, unspecified: Secondary | ICD-10-CM

## 2019-12-05 NOTE — Telephone Encounter (Signed)
Left message for pt to call me back 

## 2019-12-13 ENCOUNTER — Other Ambulatory Visit: Payer: Self-pay | Admitting: Family Medicine

## 2019-12-13 DIAGNOSIS — F32A Depression, unspecified: Secondary | ICD-10-CM

## 2019-12-13 DIAGNOSIS — F329 Major depressive disorder, single episode, unspecified: Secondary | ICD-10-CM

## 2019-12-13 MED ORDER — SERTRALINE HCL 50 MG PO TABS: 50.0000 mg | ORAL_TABLET | Freq: Every day | ORAL | 0 refills | Status: AC

## 2019-12-13 NOTE — Telephone Encounter (Signed)
Pt has upcoming appt and needs med refilled before her appt

## 2019-12-19 ENCOUNTER — Other Ambulatory Visit: Payer: Self-pay

## 2019-12-19 ENCOUNTER — Encounter: Payer: Self-pay | Admitting: Family Medicine

## 2019-12-19 ENCOUNTER — Ambulatory Visit: Payer: 59 | Admitting: Family Medicine

## 2019-12-19 VITALS — BP 124/82 | HR 75 | Temp 97.3°F | Ht 68.0 in | Wt 207.0 lb

## 2019-12-19 DIAGNOSIS — G4719 Other hypersomnia: Secondary | ICD-10-CM | POA: Diagnosis not present

## 2019-12-19 DIAGNOSIS — F32A Depression, unspecified: Secondary | ICD-10-CM

## 2019-12-19 DIAGNOSIS — N920 Excessive and frequent menstruation with regular cycle: Secondary | ICD-10-CM

## 2019-12-19 DIAGNOSIS — F419 Anxiety disorder, unspecified: Secondary | ICD-10-CM

## 2019-12-19 DIAGNOSIS — R0683 Snoring: Secondary | ICD-10-CM | POA: Diagnosis not present

## 2019-12-19 DIAGNOSIS — Z833 Family history of diabetes mellitus: Secondary | ICD-10-CM

## 2019-12-19 DIAGNOSIS — R5383 Other fatigue: Secondary | ICD-10-CM | POA: Diagnosis not present

## 2019-12-19 DIAGNOSIS — Z862 Personal history of diseases of the blood and blood-forming organs and certain disorders involving the immune mechanism: Secondary | ICD-10-CM

## 2019-12-19 DIAGNOSIS — Z131 Encounter for screening for diabetes mellitus: Secondary | ICD-10-CM

## 2019-12-19 DIAGNOSIS — R635 Abnormal weight gain: Secondary | ICD-10-CM

## 2019-12-19 DIAGNOSIS — F329 Major depressive disorder, single episode, unspecified: Secondary | ICD-10-CM

## 2019-12-19 NOTE — Patient Instructions (Signed)
Wean off the Zoloft by lowering to 25 mg daily x 1 week then 25 mg every other day x 1 week and then stop.   Follow up with me in 2 weeks.

## 2019-12-19 NOTE — Progress Notes (Signed)
Subjective:    Patient ID: Rebecca Shepherd, female    DOB: 19-Jul-1996, 24 y.o.   MRN: [id number]  HPI Chief Complaint  Patient presents with  . discuss weight gain    discuss weight gain and coming off depression. not sure if dep med is causing weight gain   Complains of weight gain over the past year. She is concerned that her Zoloft may be keeping her from being able to lose weight and would like to discuss coming off of this medication.  States she is in a better place emotionally than when she started on it.  States her weight gain has been gradual. States in the summer of 2019 she was 165.  Reports eating a healthy diet, approximately 1,500 calories per day and exercising 4-5 days per week.  Feels that she is doing everything possible to lose weight but has not been able to.   States she saw her OB/GYN recently for these symptoms and her thyroid was checked and normal.   She is also tired often. States she sleep 10 hours some nights and still feels very tired and sleepy.   States she snores. Excessive tiredness during the day. No history of sleep apnea but her mother and father have it.    States she has a family history of diabetes.   Stopped birth control 6 months ago.  LMP: current  Periods are heavy like they used to be before birth control.   Denies fever, chills, dizziness, chest pain, palpitations, shortness of breath, abdominal pain, N/V/D, urinary symptoms, LE edema.    She got her first Covid vaccine on Monday.    Review of Systems Pertinent positives and negatives in the history of present illness.     Objective:   Physical Exam BP 124/82   Pulse 75   Temp (!) 97.3 F (36.3 C)   Ht 5\' 8"  (1.727 m)   Wt 207 lb (93.9 kg)   LMP 12/16/2019   BMI 31.47 kg/m   Alert and in no distress. Neck is supple without adenopathy or thyromegaly. Cardiac exam shows a regular sinus rhythm without murmurs or gallops. Lungs are clear to auscultation. Extremities without  edema. Skin is warm and dry, no pallor.       Assessment & Plan:  Fatigue, unspecified type - Plan: CBC with Differential/Platelet, Comprehensive metabolic panel, VITAMIN D 25 Hydroxy (Vit-D Deficiency, Fractures), Vitamin B12, Hemoglobin A1c, Iron, TIBC and Ferritin Panel - discussed multiple etiologies for fatigue including anemia which she has a history of as well. Now having heavy periods again. Considered sleep apnea but this is clear cut with an Epworth Sleepiness Scale of 8.   Anxiety and depression -she will wean off Zoloft. We discussed how to do this. She will let me know if her mood is not good and we can consider trying a different SSRI such as Lexapro or Celexa which may be more weight neutral for her.   Excessive daytime sleepiness -Epworth 8. Considered sleep study but will hold off for now.   Snoring  Abnormal weight gain - Plan: Hemoglobin A1c -unclear as to why she is having a difficult time with weight. She appears to be eating a healthy diet and exercising. Will screen for endocrine disorders.   Menorrhagia with regular cycle - Plan: CBC with Differential/Platelet, Iron, TIBC and Ferritin Panel  Family history of diabetes mellitus in mother - Plan: Hemoglobin A1c  Screening for diabetes mellitus - Plan: Hemoglobin A1c  History of anemia -  Plan: CBC with Differential/Platelet, Iron, TIBC and Ferritin Panel

## 2019-12-20 LAB — COMPREHENSIVE METABOLIC PANEL
ALT: 17 IU/L (ref 0–32)
AST: 20 IU/L (ref 0–40)
Albumin/Globulin Ratio: 1.8 (ref 1.2–2.2)
Albumin: 4.8 g/dL (ref 3.9–5.0)
Alkaline Phosphatase: 62 IU/L (ref 39–117)
BUN/Creatinine Ratio: 17 (ref 9–23)
BUN: 11 mg/dL (ref 6–20)
Bilirubin Total: 0.4 mg/dL (ref 0.0–1.2)
CO2: 24 mmol/L (ref 20–29)
Calcium: 9.8 mg/dL (ref 8.7–10.2)
Chloride: 101 mmol/L (ref 96–106)
Creatinine, Ser: 0.65 mg/dL (ref 0.57–1.00)
GFR calc Af Amer: 146 mL/min/{1.73_m2} (ref >59)
GFR calc non Af Amer: 126 mL/min/{1.73_m2} (ref >59)
Globulin, Total: 2.6 g/dL (ref 1.5–4.5)
Glucose: 82 mg/dL (ref 65–99)
Potassium: 4.1 mmol/L (ref 3.5–5.2)
Sodium: 137 mmol/L (ref 134–144)
Total Protein: 7.4 g/dL (ref 6.0–8.5)

## 2019-12-20 LAB — CBC WITH DIFFERENTIAL/PLATELET
Basophils Absolute: 0.1 10*3/uL (ref 0.0–0.2)
Basos: 1 %
EOS (ABSOLUTE): 0.1 10*3/uL (ref 0.0–0.4)
Eos: 2 %
Hematocrit: 40.1 % (ref 34.0–46.6)
Hemoglobin: 13.4 g/dL (ref 11.1–15.9)
Immature Grans (Abs): 0 10*3/uL (ref 0.0–0.1)
Immature Granulocytes: 0 %
Lymphocytes Absolute: 2.2 10*3/uL (ref 0.7–3.1)
Lymphs: 36 %
MCH: 30.3 pg (ref 26.6–33.0)
MCHC: 33.4 g/dL (ref 31.5–35.7)
MCV: 91 fL (ref 79–97)
Monocytes Absolute: 0.4 10*3/uL (ref 0.1–0.9)
Monocytes: 6 %
Neutrophils Absolute: 3.5 10*3/uL (ref 1.4–7.0)
Neutrophils: 55 %
Platelets: 239 10*3/uL (ref 150–450)
RBC: 4.42 x10E6/uL (ref 3.77–5.28)
RDW: 12.1 % (ref 11.7–15.4)
WBC: 6.3 10*3/uL (ref 3.4–10.8)

## 2019-12-20 LAB — IRON,TIBC AND FERRITIN PANEL
Ferritin: 60 ng/mL (ref 15–150)
Iron Saturation: 20 % (ref 15–55)
Iron: 67 ug/dL (ref 27–159)
Total Iron Binding Capacity: 337 ug/dL (ref 250–450)
UIBC: 270 ug/dL (ref 131–425)

## 2019-12-20 LAB — VITAMIN D 25 HYDROXY (VIT D DEFICIENCY, FRACTURES): Vit D, 25-Hydroxy: 20.9 ng/mL — ABNORMAL LOW (ref 30.0–100.0)

## 2019-12-20 LAB — HEMOGLOBIN A1C
Est. average glucose Bld gHb Est-mCnc: 91 mg/dL
Hgb A1c MFr Bld: 4.8 % (ref 4.8–5.6)

## 2019-12-20 LAB — VITAMIN B12: Vitamin B-12: 594 pg/mL (ref 232–1245)

## 2019-12-25 ENCOUNTER — Telehealth: Payer: Self-pay | Admitting: Family Medicine

## 2019-12-25 NOTE — Telephone Encounter (Signed)
  Please call pt  She received first covid injection last Monday and she is concerned about reaction she is having at injection site ( she has reacted to injections before) Itchy at site x 1 week but over last couple of days has become painful and it is warm to th touch  Also states there is a knot  She has moved to [place] but still comes here but probably could not get here today. IF she needs to be seen, could it be virtual?

## 2019-12-25 NOTE — Telephone Encounter (Signed)
I called.  Her arm is actually getting worse after 1 week from getting her shot.  Recommended that she get this looked at to make sure it is not a secondary infection.

## 2019-12-26 ENCOUNTER — Encounter: Payer: Self-pay | Admitting: Family Medicine

## 2019-12-26 ENCOUNTER — Other Ambulatory Visit: Payer: Self-pay

## 2019-12-26 ENCOUNTER — Ambulatory Visit: Payer: 59 | Admitting: Family Medicine

## 2019-12-26 VITALS — Temp 98.9°F

## 2019-12-26 DIAGNOSIS — T50Z95A Adverse effect of other vaccines and biological substances, initial encounter: Secondary | ICD-10-CM

## 2019-12-26 NOTE — Telephone Encounter (Signed)
She can be seen in office if needed by someone.

## 2019-12-26 NOTE — Progress Notes (Signed)
   Subjective:  Documentation for virtual audio and video telecommunications through [facility] encounter:  The patient was located at home. 2 patient identifiers used.  The provider was located in the office. The patient did consent to this visit and is aware of possible charges through their insurance for this visit.  The other persons participating in this telemedicine service were none.    Patient ID: Rebecca Shepherd, female    DOB: 07-09-1996, 24 y.o.   MRN: [id number]  HPI Chief Complaint  Patient presents with  . possible infection    possible infection- itching, warm to touch, size of a baseball- will upload picture, fever the last 2 days, arm feels heavy   She has concerns regarding redness, warmth, swelling and painful arm at the site of her Covid vaccine. She received the Moderna Covid vaccine on Monday 12/17/2019.  Symptoms shortly after the vaccine were nausea and fatigue but her arm did not look abnormal until 3 days ago.   States she went to an UC and they tried to aspirate it but nothing was expelled. They put her on a course of  Doxycycline to be safe. Her arm feels and looks worse today.   She sent pictures via Mychart.   She feels ok otherwise but is concerned about getting the 2nd Moderna vaccine understandably.     Review of Systems Pertinent positives and negatives in the history of present illness.     Objective:   Physical Exam Temp 98.9 F (37.2 C)   LMP 12/16/2019   Alert and oriented and in no acute distress. Normal respirations. Right arm with a large circular area of erythema and a lighter outer band as well. A black dot in the center is from attempted aspiration yesterday. No drainage. No apparent fluctuance.       Assessment & Plan:  Adverse effect of vaccine, initial encounter  Discussed that her arm is most likely worse today due to the attempted aspiration at [facility] yesterday.  She will continue on Doxycycline since she started this yesterday.  Advised of sun sensitivity with this drug and to avoid pregnancy.  Continue with cool compresses, benadryl at bedtime if needed. Suspect she should turn the corner and not get worse but she will let me know.  I do recommend that she get the 2nd Covid vaccine when due although she is hesitant which is understandable.   Time spent on call was 14 minutes and in review of previous records 15 minutes total.  This virtual service is not related to other E/M service within previous 7 days.

## 2019-12-26 NOTE — Telephone Encounter (Signed)
Pt. Called back today stating that she spoke with someone here yesterday about here covid vaccine she got about 1 and half weeks ago. She is having an allergic reaction to the shot the injection site is warm and hard she did go to an Urgent Care and they gave her an anti biotic but she said this morning the place on her arm is looking worse.

## 2019-12-26 NOTE — Telephone Encounter (Signed)
Spoke to pt and she can not come into the office due to her living in [place]. Pt has agreed to a virtual today at 3:15. KH

## 2020-01-03 ENCOUNTER — Other Ambulatory Visit: Payer: Self-pay

## 2020-01-03 ENCOUNTER — Ambulatory Visit: Payer: 59 | Admitting: Family Medicine

## 2020-01-03 ENCOUNTER — Encounter: Payer: Self-pay | Admitting: Family Medicine

## 2020-01-03 VITALS — Wt 200.0 lb

## 2020-01-03 DIAGNOSIS — F419 Anxiety disorder, unspecified: Secondary | ICD-10-CM | POA: Diagnosis not present

## 2020-01-03 DIAGNOSIS — F32A Depression, unspecified: Secondary | ICD-10-CM

## 2020-01-03 DIAGNOSIS — F329 Major depressive disorder, single episode, unspecified: Secondary | ICD-10-CM

## 2020-01-03 NOTE — Progress Notes (Signed)
   Subjective:  Documentation for virtual audio and video telecommunications through [facility] encounter:  The patient was located at home. 2 patient identifiers used.  The provider was located in the office. The patient did consent to this visit and is aware of possible charges through their insurance for this visit.  The other persons participating in this telemedicine service were none.    Patient ID: Rebecca Shepherd, female    DOB: 22-Sep-1996, 24 y.o.   MRN: [id number]  HPI Chief Complaint  Patient presents with  . follow-up    follow-up on depression, taking every other day but wants to talk about going on a new med as she has seen a shift in moods   This is a follow up on depression and coming off sertraline.  She has been weaning off and is now on 25 mg every other day.  She should be stopping soon.  States she does not feel depressed or the same way that she did prior to starting on the medication.  States she at times has less patience but she is okay with this.  States she is due to start her period in a few days and thinks this may be playing a role in her current mood.  She would like to hold off on any other medication for depression at this time.  States she is seeing her OB/GYN soon to have an IUD inserted.  She is hopeful that she will be able to lose weight since she has been having a difficult time and has been trying.  No new concerns today.  No fever, chills, chest pain, palpitations, shortness of breath, nausea, vomiting.   Review of Systems Pertinent positives and negatives in the history of present illness.     Objective:   Physical Exam Wt 200 lb (90.7 kg)   LMP 12/16/2019   BMI 30.41 kg/m   Alert and oriented in no acute distress.  Normal speech, mood and thought process.      Assessment & Plan:  Anxiety and depression  She is weaning off sertraline and would like to hold off on any medications for anxiety depression at this point.  Denies  feeling depressed.  She will let me know if she would like to try a different medication.  We did discuss more weight neutral SSRI such as Celexa or Lexapro if she would like. She will follow-up with me in 3 months for her CPE which is overdue, or sooner if needed.  Time spent on call was 9 minutes and in review of previous records 10 minutes total.  This virtual service is not related to other E/M service within previous 7 days.

## 2020-03-19 ENCOUNTER — Other Ambulatory Visit: Payer: Self-pay

## 2020-03-19 MED ORDER — MONTELUKAST SODIUM 10 MG PO TABS: ORAL_TABLET | ORAL | 0 refills | Status: DC

## 2020-06-26 ENCOUNTER — Other Ambulatory Visit: Payer: Self-pay | Admitting: Family Medicine

## 2020-10-01 ENCOUNTER — Other Ambulatory Visit: Payer: Self-pay | Admitting: Family Medicine

## 2020-12-30 ENCOUNTER — Other Ambulatory Visit: Payer: Self-pay | Admitting: Family Medicine

## 2022-08-18 ENCOUNTER — Encounter: Payer: Self-pay | Admitting: Internal Medicine
